# Patient Record
Sex: Male | Born: 1955 | Race: White | Hispanic: No | Marital: Single | Smoking: Current every day smoker
Health system: Southern US, Community
[De-identification: ages and names within clinical notes are randomized; demographics above are authoritative.]

## PROBLEM LIST (undated history)

## (undated) DIAGNOSIS — E118 Type 2 diabetes mellitus with unspecified complications: Secondary | ICD-10-CM

## (undated) DIAGNOSIS — I480 Paroxysmal atrial fibrillation: Secondary | ICD-10-CM

## (undated) HISTORY — PX: TONSILLECTOMY: SUR1361

## (undated) HISTORY — DX: Paroxysmal atrial fibrillation: I48.0

## (undated) HISTORY — DX: Type 2 diabetes mellitus with unspecified complications: E11.8

---

## 2008-06-02 ENCOUNTER — Ambulatory Visit: Payer: Self-pay | Admitting: Urology

## 2008-06-03 ENCOUNTER — Ambulatory Visit: Payer: Self-pay | Admitting: Urology

## 2008-06-07 ENCOUNTER — Ambulatory Visit: Payer: Self-pay | Admitting: Urology

## 2013-01-14 ENCOUNTER — Ambulatory Visit: Payer: Self-pay | Admitting: Cardiology

## 2014-10-26 ENCOUNTER — Ambulatory Visit
Admission: EM | Admit: 2014-10-26 | Discharge: 2014-10-26 | Disposition: A | Discharge status: Home / Self Care | Payer: BLUE CROSS/BLUE SHIELD | Attending: Physician Assistant | Admitting: Physician Assistant

## 2014-10-26 ENCOUNTER — Ambulatory Visit: Payer: BLUE CROSS/BLUE SHIELD

## 2014-10-26 DIAGNOSIS — J0100 Acute maxillary sinusitis, unspecified: Secondary | ICD-10-CM | POA: Diagnosis not present

## 2014-10-26 DIAGNOSIS — R51 Headache: Secondary | ICD-10-CM | POA: Diagnosis present

## 2014-10-26 DIAGNOSIS — J40 Bronchitis, not specified as acute or chronic: Secondary | ICD-10-CM | POA: Insufficient documentation

## 2014-10-26 MED ORDER — HYDROCOD POLST-CPM POLST ER 10-8 MG/5ML PO SUER: 5.0000 mL | Freq: Every evening | ORAL | Status: DC | PRN

## 2014-10-26 MED ORDER — FLUTICASONE PROPIONATE 50 MCG/ACT NA SUSP: 2.0000 | Freq: Every day | NASAL | Status: DC

## 2014-10-26 MED ORDER — DOXYCYCLINE HYCLATE 100 MG PO CAPS: 100.0000 mg | ORAL_CAPSULE | Freq: Two times a day (BID) | ORAL | Status: DC

## 2014-10-26 MED ORDER — KETOROLAC TROMETHAMINE 60 MG/2ML IM SOLN
60.0000 mg | Freq: Once | INTRAMUSCULAR | Status: AC
  Administered 2014-10-26: 60 mg via INTRAMUSCULAR

## 2014-10-26 MED ORDER — BENZONATATE 100 MG PO CAPS: 100.0000 mg | ORAL_CAPSULE | Freq: Three times a day (TID) | ORAL | Status: DC

## 2014-10-26 MED ORDER — IPRATROPIUM-ALBUTEROL 0.5-2.5 (3) MG/3ML IN SOLN
3.0000 mL | Freq: Once | RESPIRATORY_TRACT | Status: AC
  Administered 2014-10-26: 3 mL via RESPIRATORY_TRACT

## 2014-10-26 NOTE — Discharge Instructions (Signed)

## 2014-10-26 NOTE — ED Provider Notes (Signed)
CSN: [id number]     Arrival date & time 10/26/14  1216 History   None    Chief Complaint  Patient presents with  . Facial Pain   (Consider location/radiation/quality/duration/timing/severity/associated sxs/prior Treatment) HPI  59 yo M with 3 weeks hx of increasing nasal congestion, cough and sneezing. Exposed to high pollen in workshop near a field and feels he is having a reaction to same. Hx of sinus issues in the past. Present today because of persistant cough, particularly at night; and the recent onset of facial and tooth pain. DM2- Hx AFib/Plavix    Everyday smoker-cigars -greater than 25 years  Past Medical History  Diagnosis Date  . Diabetes mellitus without complication   . Atrial fibrillation    Past Surgical History  Procedure Laterality Date  . Tonsillectomy     Family History  Problem Relation Age of Onset  . Cancer Mother    History  Substance Use Topics  . Smoking status: Current Every Day Smoker    Types: Cigars  . Smokeless tobacco: Not on file  . Alcohol Use: No    Review of Systems  HENT: Positive for congestion, postnasal drip, rhinorrhea, sinus pressure and sneezing.   Eyes: Negative.   Respiratory: Positive for cough and wheezing.   Cardiovascular: Negative.   Gastrointestinal: Negative.   Endocrine: Negative.   Genitourinary: Negative.   Musculoskeletal: Negative.   Skin: Negative.   Neurological: Positive for headaches. Negative for light-headedness and numbness.  Hematological: Negative.   Psychiatric/Behavioral: Negative.     Allergies  Penicillins  Home Medications   Prior to Admission medications   Medication Sig Start Date End Date Taking? Authorizing Provider  clopidogrel (PLAVIX) 75 MG tablet Take 75 mg by mouth daily.   Yes Historical Provider, MD  metFORMIN (GLUCOPHAGE) 500 MG tablet Take by mouth 2 (two) times daily with a meal.   Yes Historical Provider, MD  benzonatate (TESSALON) 100 MG capsule Take 1 capsule (100 mg total)  by mouth every 8 (eight) hours. 10/26/14   Rae HalstedLaurie W Alison Breeding, PA-C  chlorpheniramine-HYDROcodone [facility](TUSSIONEX PENNKINETIC ER) 10-8 MG/5ML SUER Take 5 mLs by mouth at bedtime as needed for cough. 10/26/14   Rae HalstedLaurie W Roniyah Llorens, PA-C  doxycycline (VIBRAMYCIN) 100 MG capsule Take 1 capsule (100 mg total) by mouth 2 (two) times daily. 10/26/14   Rae HalstedLaurie W Shirlie Enck, PA-C  fluticasone [facility](FLONASE) 50 MCG/ACT nasal spray Place 2 sprays into both nostrils daily. 10/26/14   Rae HalstedLaurie W Tytionna Cloyd, PA-C   BP 117/68 mmHg  Pulse 60  Temp(Src) 97.3 F (36.3 C) (Tympanic)  Resp 18  Ht 5\' 11"  (1.803 m)  Wt 220 lb (99.791 kg)  BMI 30.70 kg/m2  SpO2 98% Physical Exam  Constitutional: He appears well-developed and well-nourished.  HENT:  Head: Atraumatic.  Right Ear: Tympanic membrane, external ear and ear canal normal.  Left Ear: Tympanic membrane, external ear and ear canal normal.  Eyes: EOM are normal.  Neck: Normal range of motion. Neck supple.  Cardiovascular: An irregularly irregular rhythm present.  Known hx of atrial fib - on Pradaxa and Plavix  but denies any other intervention - he denies symptoms  Pulmonary/Chest: Effort normal. He has wheezes.  Abdominal: Soft. There is no tenderness. There is no rebound.  Musculoskeletal: Normal range of motion.  Lymphadenopathy:    He has no cervical adenopathy.  Skin: Skin is warm and dry.  Pressure/headache complaint frontal and upper teeth/cheekbones pressure discomfort. Nasal discharge yellow. Cough during day- exacerbates at night  Post nasal drip, ED Course  Procedures (including critical care time) Labs Review Labs Reviewed - No data to display  DuoNeb was effective in easing chest congestion- patient reported feeling more comfortable post therapy. Toradol 60 mg IM effectively relieved him of his headache  Imaging Review Dg Chest 2 View  10/26/2014   CLINICAL DATA:  Wheezing, cough, smoking history  EXAM: CHEST  2 VIEW  COMPARISON:  None.  FINDINGS: No active infiltrate  or effusion is seen. There is peribronchial thickening which suggest bronchitis. Mediastinal and hilar contours are unremarkable. The heart is borderline enlarged. No bony abnormality is seen.  IMPRESSION: Peribronchial thickening may indicate bronchitis. No pneumonia or effusion is seen.   Electronically Signed   By: Dwyane DeePaul  Barry M.D.   On: 10/26/2014 14:20     MDM   1. Acute maxillary sinusitis, recurrence not specified   2. Bronchitis     Will Rx to cover sinuses and chest.. Have discussed seasonal allergies and patient will initiate Claritin and Flonase from now until first hard freeze. Rx for day and night cough discussed. Questions invited and answered. recommendations and expectations reviewed. RTC [facility] with questions, concerns, exacerbation. Report to PCP for re-evalaution/update  Rae HalstedLaurie W Christabelle Hanzlik, PA-C 10/26/14 2152

## 2014-10-26 NOTE — ED Notes (Signed)
States started 2 weeks ago with runny nose. Now worsening face pain/behind eyes.

## 2015-01-04 ENCOUNTER — Telehealth: Payer: Self-pay

## 2015-01-04 MED ORDER — CANAGLIFLOZIN 100 MG PO TABS: 100.0000 mg | ORAL_TABLET | Freq: Every day | ORAL | Status: DC

## 2015-01-04 NOTE — Telephone Encounter (Signed)
We'll switch from [place] to [place] I talked with patient Pick up Rx and savings card

## 2015-01-04 NOTE — Telephone Encounter (Signed)
Dr. Sherie Don, You saw this patient in May, it is documented that the Jardiance was too expensive and you would consider writing an Rx for something less.  Would you please review the note in PP and consider what another medication could be ?  Thank you- [facility] is pharmacy of choice

## 2015-01-04 NOTE — Telephone Encounter (Signed)
Pt called stated insurance will no longer pay for Jardance. Pt wants to know if something else can be sent to the pharmacy. Pharm is [facility]. Please call pt with any issues. Thanks.

## 2015-01-28 ENCOUNTER — Telehealth: Payer: Self-pay | Admitting: Unknown Physician Specialty

## 2015-01-28 MED ORDER — CANAGLIFLOZIN 300 MG PO TABS: 300.0000 mg | ORAL_TABLET | Freq: Every day | ORAL | Status: DC

## 2015-01-28 MED ORDER — METFORMIN HCL 1000 MG PO TABS: 1000.0000 mg | ORAL_TABLET | Freq: Two times a day (BID) | ORAL | Status: DC

## 2015-01-28 NOTE — Telephone Encounter (Signed)
I did the research; a PA saw him in May for an infection and she entered the metformin differently (two of the 500 mg BID instead of one of the 1000 mg BID) I clarified with patient and corrected the med list; he did not need refill; he asked if he could double his metformin for a while since his sugars are high --> NO I will increase the Invokana from 100 mg to 300 mg, new Rx to pharmacy

## 2015-01-28 NOTE — Telephone Encounter (Signed)
Pt has questions about his metformin rx. It says take twice daily but he has always taken one twice a day and he would like clarification.

## 2015-01-28 NOTE — Telephone Encounter (Signed)
Looking at [facility].  He is Dr. Marlise Eves pt

## 2015-01-28 NOTE — Telephone Encounter (Signed)
Routing to provider. Practice partner number is [phone]. Practice partner says the is a [facility] patient, but Elnita Maxwell prescribed this medication.

## 2015-01-28 NOTE — Telephone Encounter (Signed)
Routing to Dr. Lada 

## 2015-01-31 ENCOUNTER — Telehealth: Payer: Self-pay

## 2015-01-31 NOTE — Telephone Encounter (Signed)
He should be taking 1000 mg metformin BID period There was a mix-up months ago and apparently a PA at urgent care entered the dose as 500 mg I just need him to take 1000 mg BID Please work this out with patient and pharmacy I've already talked to the patient once before

## 2015-01-31 NOTE — Telephone Encounter (Signed)
In regards to his Metformin, pharmacy states that back in April and all the time before he was taking 1 tablet twice a day. He thinks he is still supposed to take only 1 tablet twice a day and that's how he has been taking it. Please advise on correct directions.

## 2015-02-01 NOTE — Telephone Encounter (Signed)
No answer, unable to leave a message. °

## 2015-02-03 NOTE — Telephone Encounter (Signed)
I spoke with patient, he states that he is taking the 1,000mg  BID. He said some of his bottles have the wrong directions but he has been taking  BID.

## 2015-03-04 ENCOUNTER — Telehealth: Payer: Self-pay

## 2015-03-04 NOTE — Telephone Encounter (Signed)
Your patient.  Thanks 

## 2015-03-04 NOTE — Telephone Encounter (Signed)
Patient was last seen 10/22/14, practice partner number is [phone], and pharmacy is [place].

## 2015-03-05 NOTE — Telephone Encounter (Signed)
I don't have any record of [place] ever prescribing Plavix for him, either in this chart or the Practice Partner chart I'm going to deny this Instruct patient to call the prescriber who gave him this to see if he's really supposed to be taking it with Pradaxa; that could increase risk of serious bleeding Patient is overdue for visit anyway (was due to see me in mid-August) Please schedule him for visit soon

## 2015-03-07 NOTE — Telephone Encounter (Signed)
Spoke with patient and stated that he is overdue for a f/u appointment. He said he would look at his schedule and call us back make it.

## 2015-04-07 ENCOUNTER — Other Ambulatory Visit: Payer: Self-pay | Admitting: Unknown Physician Specialty

## 2015-04-07 NOTE — Telephone Encounter (Signed)
Your patient.  Thanks 

## 2015-04-07 NOTE — Telephone Encounter (Signed)
Please ask patient to get this from his cardiologist He does not appear to be taking it correctly; last filled by Gabriel Cirriheryl Wicker for a 2 month supply back in May; have him call cardiologist to discuss this medicine and get refills

## 2015-04-08 NOTE — Telephone Encounter (Signed)
Tried to call patient but there was no answer and no voicemail set up. Will try again later. 

## 2015-04-11 NOTE — Telephone Encounter (Signed)
Called patient and told him him what Dr. Sherie DonLada said. Patient stated that he has not seen the cardiologist in about 2 years. States he was supposed to follow-up with them 1 year later but he never went. Patient [place] and is going to make an appointment with us within the month because his medication will run out in a month. I told the patient that I would let Dr. Sherie DonLada know this information.

## 2015-04-11 NOTE — Telephone Encounter (Signed)
I read Dr. Darrold JunkerParaschos' last note 2014; I don't see any record of Plavix in our system or his system Rx sent, but we'll want to see him soon

## 2015-06-01 ENCOUNTER — Encounter: Payer: Self-pay | Admitting: *Deleted

## 2015-06-01 ENCOUNTER — Encounter: Payer: Self-pay | Admitting: Family Medicine

## 2015-06-01 ENCOUNTER — Ambulatory Visit (INDEPENDENT_AMBULATORY_CARE_PROVIDER_SITE_OTHER): Payer: BLUE CROSS/BLUE SHIELD | Admitting: Family Medicine

## 2015-06-01 VITALS — BP 127/76 | HR 59 | Resp 16 | Ht 68.0 in | Wt 222.0 lb

## 2015-06-01 DIAGNOSIS — Z7189 Other specified counseling: Secondary | ICD-10-CM

## 2015-06-01 DIAGNOSIS — F17200 Nicotine dependence, unspecified, uncomplicated: Secondary | ICD-10-CM

## 2015-06-01 DIAGNOSIS — I499 Cardiac arrhythmia, unspecified: Secondary | ICD-10-CM

## 2015-06-01 DIAGNOSIS — Z7689 Persons encountering health services in other specified circumstances: Secondary | ICD-10-CM | POA: Insufficient documentation

## 2015-06-01 DIAGNOSIS — I480 Paroxysmal atrial fibrillation: Secondary | ICD-10-CM | POA: Insufficient documentation

## 2015-06-01 DIAGNOSIS — E1165 Type 2 diabetes mellitus with hyperglycemia: Secondary | ICD-10-CM | POA: Insufficient documentation

## 2015-06-01 DIAGNOSIS — Z720 Tobacco use: Secondary | ICD-10-CM

## 2015-06-01 DIAGNOSIS — E119 Type 2 diabetes mellitus without complications: Secondary | ICD-10-CM | POA: Diagnosis not present

## 2015-06-01 DIAGNOSIS — I482 Chronic atrial fibrillation, unspecified: Secondary | ICD-10-CM

## 2015-06-01 LAB — POCT GLYCOSYLATED HEMOGLOBIN (HGB A1C): Hemoglobin A1C: 8

## 2015-06-01 MED ORDER — METFORMIN HCL 1000 MG PO TABS: 1000.0000 mg | ORAL_TABLET | Freq: Two times a day (BID) | ORAL | Status: DC

## 2015-06-01 MED ORDER — DABIGATRAN ETEXILATE MESYLATE 150 MG PO CAPS: 150.0000 mg | ORAL_CAPSULE | Freq: Two times a day (BID) | ORAL | Status: DC

## 2015-06-01 MED ORDER — CANAGLIFLOZIN 300 MG PO TABS: 300.0000 mg | ORAL_TABLET | Freq: Every day | ORAL | Status: DC

## 2015-06-01 NOTE — Progress Notes (Signed)
Name: Jeremy KochBobby Darrell Claytor Jr.   MRN: [id number]    DOB: August 18, 1955   Date:06/01/2015       Progress Note  Subjective  Chief Complaint  Chief Complaint  Patient presents with  . Establish Care  . Diabetes    120-+220 range A1C 8.0 X 6 MO    HPI Here to establish care.  Former pt. Of Dr. Dossie Arbourrissman.  Has DM.  He has been our of Invokana x 3-4 weeks.  His BSs run from 150-220 even on the Invokana.    He has hx of A. Fib.  Has been on Pradaxa for several years.  Has not seen Card (Dr. Gwen PoundsKowalski) in 2 years.  He cannot tell when he is in a. Fib. No problem-specific assessment & plan notes found for this encounter.   Past Medical History  Diagnosis Date  . Diabetes mellitus without complication (HCC)   . Atrial fibrillation (HCC)   . Atrial fibrillation [facility](HCC)     Past Surgical History  Procedure Laterality Date  . Tonsillectomy      Family History  Problem Relation Age of Onset  . Cancer Mother     Social History   Social History  . Marital Status: Single    Spouse Name: N/A  . Number of Children: N/A  . Years of Education: N/A   Occupational History  . Not on file.   Social History Main Topics  . Smoking status: Current Every Day Smoker    Types: Cigars  . Smokeless tobacco: Never Used  . Alcohol Use: No  . Drug Use: No  . Sexual Activity: Not on file   Other Topics Concern  . Not on file   Social History Narrative     Current outpatient prescriptions:  .  dabigatran (PRADAXA) 150 MG CAPS capsule, Take 1 capsule (150 mg total) by mouth 2 (two) times daily., Disp: 180 capsule, Rfl: 3 .  metFORMIN (GLUCOPHAGE) 1000 MG tablet, Take 1 tablet (1,000 mg total) by mouth 2 (two) times daily with a meal., Disp: 180 tablet, Rfl: 3 .  canagliflozin (INVOKANA) 300 MG TABS tablet, Take 300 mg by mouth daily before breakfast., Disp: 90 tablet, Rfl: 3  Allergies  Allergen Reactions  . Penicillins      Review of Systems  Constitutional: Negative for fever, chills,  weight loss and malaise/fatigue.  HENT: Negative for hearing loss.   Eyes: Negative for blurred vision and double vision.  Respiratory: Negative for cough, shortness of breath and wheezing.   Cardiovascular: Negative for chest pain, palpitations and leg swelling.  Gastrointestinal: Negative for heartburn and nausea.  Genitourinary: Negative for dysuria, urgency and frequency.  Musculoskeletal: Negative for myalgias and joint pain.  Skin: Negative for rash.  Neurological: Negative for dizziness, tremors, weakness and headaches.      Objective  Filed Vitals:   06/01/15 1424  BP: 127/76  Pulse: 59  Resp: 16  Height: 5\' 8"  (1.727 m)  Weight: 222 lb (100.699 kg)    Physical Exam  Constitutional: He is oriented to person, place, and time and well-developed, well-nourished, and in no distress. No distress.  Eyes: Conjunctivae are normal. Pupils are equal, round, and reactive to light. No scleral icterus.  Neck: Normal range of motion. Neck supple. Carotid bruit is not present. No thyromegaly present.  Cardiovascular: Normal rate and normal heart sounds.  An irregular rhythm present. Frequent extrasystoles are present. Exam reveals no gallop and no friction rub.   No murmur heard. EKG-Left axis.  PACs.  Pulmonary/Chest: Effort normal and breath sounds normal. No respiratory distress. He has no wheezes. He has no rales.  Abdominal: Soft. Bowel sounds are normal. He exhibits no distension, no abdominal bruit and no mass. There is no tenderness.  Musculoskeletal: He exhibits no edema.  Lymphadenopathy:    He has no cervical adenopathy.  Neurological: He is alert and oriented to person, place, and time.  Vitals reviewed.      Recent Results (from the past 2160 hour(s))  POCT HgB A1C     Status: Abnormal   Collection Time: 06/01/15  2:31 PM  Result Value Ref Range   Hemoglobin A1C 8.0      Assessment & Plan  Problem List Items Addressed This Visit      Cardiovascular and  Mediastinum   A-fib (HCC)   Relevant Medications   dabigatran (PRADAXA) 150 MG CAPS capsule   Other Relevant Orders   Ambulatory referral to Cardiology     Endocrine   Diabetes (HCC)   Relevant Medications   canagliflozin (INVOKANA) 300 MG TABS tablet   metFORMIN (GLUCOPHAGE) 1000 MG tablet   Other Relevant Orders   POCT HgB A1C (Completed)     Other   Encounter to establish care - Primary    Other Visit Diagnoses    Smoker        Relevant Orders    Provide Smoking / Tobacco cessation education.  Provide education material and information on community counseling.    Irregular heart beat        Relevant Orders    EKG 12-Lead       Meds ordered this encounter  Medications  . canagliflozin (INVOKANA) 300 MG TABS tablet    Sig: Take 300 mg by mouth daily before breakfast.    Dispense:  90 tablet    Refill:  3    Increasing dose to 300 mg daily; cancel the 100 mg pills  . metFORMIN (GLUCOPHAGE) 1000 MG tablet    Sig: Take 1 tablet (1,000 mg total) by mouth 2 (two) times daily with a meal.    Dispense:  180 tablet    Refill:  3  . dabigatran (PRADAXA) 150 MG CAPS capsule    Sig: Take 1 capsule (150 mg total) by mouth 2 (two) times daily.    Dispense:  180 capsule    Refill:  3   1. Encounter to establish care   2. Type 2 diabetes mellitus without complication, unspecified long term insulin use status (HCC)  - POCT HgB A1C-8.o - canagliflozin (INVOKANA) 300 MG TABS tablet; Take 300 mg by mouth daily before breakfast.  Dispense: 90 tablet; Refill: 3 - metFORMIN (GLUCOPHAGE) 1000 MG tablet; Take 1 tablet (1,000 mg total) by mouth 2 (two) times daily with a meal.  Dispense: 180 tablet; Refill: 3  3. Chronic atrial fibrillation (HCC)  - dabigatran (PRADAXA) 150 MG CAPS capsule; Take 1 capsule (150 mg total) by mouth 2 (two) times daily.  Dispense: 180 capsule; Refill: 3 - Ambulatory referral to Cardiology  4. Smoker  - Provide Smoking / Tobacco cessation education.   Provide education material and information on community counseling.  5. Irregular heart beat  - EKG 12-Lead-PACs.  Left axis

## 2015-06-01 NOTE — Patient Instructions (Signed)
Discussed diet and weight loss and stopping smoking cigars.

## 2015-06-07 ENCOUNTER — Other Ambulatory Visit: Payer: Self-pay | Admitting: Family Medicine

## 2015-06-07 DIAGNOSIS — E119 Type 2 diabetes mellitus without complications: Secondary | ICD-10-CM

## 2015-06-07 MED ORDER — METFORMIN HCL 1000 MG PO TABS: 1000.0000 mg | ORAL_TABLET | Freq: Two times a day (BID) | ORAL | Status: DC

## 2015-06-22 ENCOUNTER — Other Ambulatory Visit: Payer: Self-pay | Admitting: *Deleted

## 2015-06-22 ENCOUNTER — Other Ambulatory Visit: Payer: Self-pay | Admitting: Unknown Physician Specialty

## 2015-06-22 DIAGNOSIS — E119 Type 2 diabetes mellitus without complications: Secondary | ICD-10-CM

## 2015-06-22 MED ORDER — METFORMIN HCL 1000 MG PO TABS: 1000.0000 mg | ORAL_TABLET | Freq: Two times a day (BID) | ORAL | Status: DC

## 2015-07-25 ENCOUNTER — Ambulatory Visit: Payer: BLUE CROSS/BLUE SHIELD | Admitting: Cardiovascular Disease

## 2015-07-26 ENCOUNTER — Ambulatory Visit: Payer: BLUE CROSS/BLUE SHIELD | Admitting: Cardiovascular Disease

## 2015-08-01 ENCOUNTER — Ambulatory Visit (INDEPENDENT_AMBULATORY_CARE_PROVIDER_SITE_OTHER): Payer: BLUE CROSS/BLUE SHIELD | Admitting: Family Medicine

## 2015-08-01 ENCOUNTER — Encounter: Payer: Self-pay | Admitting: Family Medicine

## 2015-08-01 VITALS — BP 125/60 | HR 60 | Resp 16 | Ht 68.0 in | Wt 220.3 lb

## 2015-08-01 DIAGNOSIS — I482 Chronic atrial fibrillation, unspecified: Secondary | ICD-10-CM

## 2015-08-01 DIAGNOSIS — N200 Calculus of kidney: Secondary | ICD-10-CM | POA: Diagnosis not present

## 2015-08-01 DIAGNOSIS — E119 Type 2 diabetes mellitus without complications: Secondary | ICD-10-CM | POA: Diagnosis not present

## 2015-08-01 DIAGNOSIS — Z23 Encounter for immunization: Secondary | ICD-10-CM

## 2015-08-01 MED ORDER — TRAMADOL HCL 50 MG PO TABS: 50.0000 mg | ORAL_TABLET | Freq: Three times a day (TID) | ORAL | Status: DC | PRN

## 2015-08-01 NOTE — Progress Notes (Signed)
Name: Jeremy Kelly.   MRN: [id number]    DOB: 03-25-1956   Date:08/01/2015       Progress Note  Subjective  Chief Complaint  Chief Complaint  Patient presents with  . Diabetes    Left ear popping    HPI Here for f/u of DM.  Also with a.fib.  Had seen Card at [facility] in past.  Is on Pradaxa for past about 2 years.    BSs 120s to 160s in AM.  2-3 hrs post prandial, BSs up to 180-210.    No problem-specific assessment & plan notes found for this encounter.   Past Medical History  Diagnosis Date  . Diabetes mellitus without complication (HCC)   . Atrial fibrillation (HCC)   . Atrial fibrillation [facility])     Past Surgical History  Procedure Laterality Date  . Tonsillectomy      Family History  Problem Relation Age of Onset  . Cancer Mother     Social History   Social History  . Marital Status: Single    Spouse Name: N/A  . Number of Children: N/A  . Years of Education: N/A   Occupational History  . Not on file.   Social History Main Topics  . Smoking status: Current Every Day Smoker    Types: Cigars  . Smokeless tobacco: Never Used  . Alcohol Use: No  . Drug Use: No  . Sexual Activity: Not on file   Other Topics Concern  . Not on file   Social History Narrative     Current outpatient prescriptions:  .  canagliflozin (INVOKANA) 300 MG TABS tablet, Take 300 mg by mouth daily before breakfast., Disp: 90 tablet, Rfl: 3 .  dabigatran (PRADAXA) 150 MG CAPS capsule, Take 1 capsule (150 mg total) by mouth 2 (two) times daily., Disp: 180 capsule, Rfl: 3 .  metFORMIN (GLUCOPHAGE) 1000 MG tablet, Take 1 tablet (1,000 mg total) by mouth 2 (two) times daily with a meal., Disp: 180 tablet, Rfl: 3 .  traMADol (ULTRAM) 50 MG tablet, Take 1 tablet (50 mg total) by mouth every 8 (eight) hours as needed., Disp: 30 tablet, Rfl: 3  Allergies  Allergen Reactions  . Penicillins      Review of Systems  Constitutional: Negative for fever, chills, weight loss and  malaise/fatigue.  HENT: Negative for hearing loss.   Eyes: Negative for blurred vision and double vision.  Respiratory: Negative for cough, shortness of breath and wheezing.   Cardiovascular: Negative for chest pain, palpitations and leg swelling.  Gastrointestinal: Negative for heartburn, abdominal pain and blood in stool.  Genitourinary: Negative for dysuria, urgency and frequency.       Having kidney stone pain recently.  Musculoskeletal: Negative for myalgias and joint pain.  Skin: Positive for rash.  Neurological: Negative for tremors, weakness and headaches.      Objective  Filed Vitals:   08/01/15 0954 08/01/15 1023  BP: 125/60   Pulse: 49 60  Resp: 16   Height:  (1.727 m)   Weight: 220 lb 4.8 oz (99.927 kg)     Physical Exam  Constitutional: He is oriented to person, place, and time and well-developed, well-nourished, and in no distress. No distress.  HENT:  Head: Normocephalic and atraumatic.  Right Ear: External ear normal.  Left Ear: External ear normal.  Eyes: Conjunctivae and EOM are normal. Pupils are equal, round, and reactive to light. No scleral icterus.  Neck: Normal range of motion. Neck supple. No thyromegaly  present.  Cardiovascular: Normal rate and normal heart sounds.  An irregularly irregular rhythm present.  Pulmonary/Chest: Effort normal and breath sounds normal. No respiratory distress. He has no wheezes. He exhibits no tenderness.  Musculoskeletal: He exhibits no edema.  Lymphadenopathy:    He has no cervical adenopathy.  Neurological: He is alert and oriented to person, place, and time.  Vitals reviewed.      Recent Results (from the past 2160 hour(s))  POCT HgB A1C     Status: Abnormal   Collection Time: 06/01/15  2:31 PM  Result Value Ref Range   Hemoglobin A1C 8.0      Assessment & Plan  Problem List Items Addressed This Visit      Cardiovascular and Mediastinum   A-fib (HCC)   Relevant Orders   CBC with Differential      Endocrine   Diabetes (HCC) - Primary   Relevant Orders   Comprehensive Metabolic Panel (CMET)   Lipid Profile     Genitourinary   Renal stones   Relevant Medications   traMADol (ULTRAM) 50 MG tablet     Other   Need for influenza vaccination   Relevant Orders   Flu Vaccine QUAD 36+ mos PF IM (Fluarix & Fluzone Quad PF) (Completed)      Meds ordered this encounter  Medications  . DISCONTD: INVOKANA 100 MG TABS tablet    Sig:     Refill:  1  . traMADol (ULTRAM) 50 MG tablet    Sig: Take 1 tablet (50 mg total) by mouth every 8 (eight) hours as needed.    Dispense:  30 tablet    Refill:  3   1. Type 2 diabetes mellitus without complication, without long-term current use of insulin (HCC)  - Comprehensive Metabolic Panel (CMET) - Lipid Profile  2. Need for influenza vaccination  - Flu Vaccine QUAD 36+ mos PF IM (Fluarix & Fluzone Quad PF)  3. Renal stones  - traMADol (ULTRAM) 50 MG tablet; Take 1 tablet (50 mg total) by mouth every 8 (eight) hours as needed.  Dispense: 30 tablet; Refill: 3  4. Chronic atrial fibrillation (HCC)  - CBC with Differential

## 2015-08-06 LAB — COMPREHENSIVE METABOLIC PANEL
ALK PHOS: 63 IU/L (ref 39–117)
ALT: 27 IU/L (ref 0–44)
AST: 19 IU/L (ref 0–40)
Albumin/Globulin Ratio: 1.6 (ref 1.1–2.5)
Albumin: 4.4 g/dL (ref 3.5–5.5)
BUN/Creatinine Ratio: 22 — ABNORMAL HIGH (ref 9–20)
BUN: 26 mg/dL — ABNORMAL HIGH (ref 6–24)
Bilirubin Total: 0.5 mg/dL (ref 0.0–1.2)
CHLORIDE: 101 mmol/L (ref 96–106)
CO2: 21 mmol/L (ref 18–29)
CREATININE: 1.19 mg/dL (ref 0.76–1.27)
Calcium: 9.5 mg/dL (ref 8.7–10.2)
GFR calc Af Amer: 77 mL/min/{1.73_m2} (ref >59)
GFR calc non Af Amer: 67 mL/min/{1.73_m2} (ref >59)
GLUCOSE: 284 mg/dL — AB (ref 65–99)
Globulin, Total: 2.7 g/dL (ref 1.5–4.5)
Potassium: 4.8 mmol/L (ref 3.5–5.2)
SODIUM: 139 mmol/L (ref 134–144)
Total Protein: 7.1 g/dL (ref 6.0–8.5)

## 2015-08-06 LAB — LIPID PANEL
CHOLESTEROL TOTAL: 119 mg/dL (ref 100–199)
Chol/HDL Ratio: 2.9 ratio units (ref 0.0–5.0)
HDL: 41 mg/dL (ref >39)
LDL Calculated: 46 mg/dL (ref 0–99)
TRIGLYCERIDES: 160 mg/dL — AB (ref 0–149)
VLDL Cholesterol Cal: 32 mg/dL (ref 5–40)

## 2015-08-06 LAB — CBC WITH DIFFERENTIAL/PLATELET
BASOS ABS: 0 10*3/uL (ref 0.0–0.2)
Basos: 1 %
EOS (ABSOLUTE): 0.2 10*3/uL (ref 0.0–0.4)
EOS: 4 %
Hematocrit: 46.5 % (ref 37.5–51.0)
Hemoglobin: 16 g/dL (ref 12.6–17.7)
IMMATURE GRANULOCYTES: 0 %
Immature Grans (Abs): 0 10*3/uL (ref 0.0–0.1)
Lymphocytes Absolute: 1.1 10*3/uL (ref 0.7–3.1)
Lymphs: 20 %
MCH: 30.6 pg (ref 26.6–33.0)
MCHC: 34.4 g/dL (ref 31.5–35.7)
MCV: 89 fL (ref 79–97)
MONOS ABS: 0.5 10*3/uL (ref 0.1–0.9)
Monocytes: 9 %
NEUTROS PCT: 66 %
Neutrophils Absolute: 3.8 10*3/uL (ref 1.4–7.0)
Platelets: 205 10*3/uL (ref 150–379)
RBC: 5.23 x10E6/uL (ref 4.14–5.80)
RDW: 13.6 % (ref 12.3–15.4)
WBC: 5.7 10*3/uL (ref 3.4–10.8)

## 2015-09-02 ENCOUNTER — Encounter (INDEPENDENT_AMBULATORY_CARE_PROVIDER_SITE_OTHER): Payer: Self-pay

## 2015-09-02 ENCOUNTER — Encounter: Payer: Self-pay | Admitting: Cardiovascular Disease

## 2015-09-02 ENCOUNTER — Ambulatory Visit (INDEPENDENT_AMBULATORY_CARE_PROVIDER_SITE_OTHER): Payer: BLUE CROSS/BLUE SHIELD | Admitting: Cardiovascular Disease

## 2015-09-02 VITALS — BP 134/72 | HR 54 | Ht 71.0 in | Wt 220.5 lb

## 2015-09-02 DIAGNOSIS — I491 Atrial premature depolarization: Secondary | ICD-10-CM | POA: Insufficient documentation

## 2015-09-02 DIAGNOSIS — E119 Type 2 diabetes mellitus without complications: Secondary | ICD-10-CM | POA: Diagnosis not present

## 2015-09-02 DIAGNOSIS — I482 Chronic atrial fibrillation, unspecified: Secondary | ICD-10-CM

## 2015-09-02 NOTE — Assessment & Plan Note (Signed)
Previous diagnosis of atrial fibrillation  We will try to obtain prior EKGs and documentation of [facility].  Records have been requested  Did  suggest he research the price of other anticoagulation medications,  Such as xarelto and eliquis

## 2015-09-02 NOTE — Patient Instructions (Signed)
You are doing well. No medication changes were made.  Check the price if xarelto (one a day), eliquis (twice a day)  Please call us if you have new issues that need to be addressed before your next appt.  Your physician wants you to follow-up in: 12 months.  You will receive a reminder letter in the mail two months in advance. If you don't receive a letter, please call our office to schedule the follow-up appointment.

## 2015-09-02 NOTE — Progress Notes (Signed)
Patient ID: Jeremy Kelly., male    DOB: Aug 02, 1955, 60 y.o.   MRN: [id number]  HPI Comments: [name] is a pleasant 60 year old gentleman, referred for consultation by Dr. Juanetta GoslingHawkins for history of atrial fibrillation.  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 No EKG was done at the time. At the time was seen someone covering for Dr. Dossie Arbourrissman.  From there he was sent to cardiology at [facility]. Records are not available at this time but have been requested on today's visit. He was given a stress test, leading to cardiac catheterization done by Dr. Cassie FreerParachos. He was told there was no significant disease. We did obtain the cardiac catheterization report that showed normal ejection fraction, no significant disease, mild luminal irregularities of the LAD. This will be scanned into our system  He was started on pradaxa 150 mill grams twice a day at the time and has continued this medication since that time  He denies any complications from the blood thinner  He does report sugars have been running high, trying to eat better Previous hemoglobin A1c in December 2016 was 8.0 Review of lab work with him shows total cholesterol 119, LDL 46  EKG on today's visit shows normal sinus rhythm with rate 54 bpm, APCs noted    Allergies  Allergen Reactions  . Penicillins     Current Outpatient Prescriptions on File Prior to Visit  Medication Sig Dispense Refill  . canagliflozin (INVOKANA) 300 MG TABS tablet Take 300 mg by mouth daily before breakfast. 90 tablet 3  . dabigatran (PRADAXA) 150 MG CAPS capsule Take 1 capsule (150 mg total) by mouth 2 (two) times daily. 180 capsule 3  . metFORMIN (GLUCOPHAGE) 1000 MG tablet Take 1 tablet (1,000 mg total) by mouth 2 (two)  times daily with a meal. 180 tablet 3   No current facility-administered medications on file prior to visit.    Past Medical History  Diagnosis Date  . Diabetes mellitus without complication (HCC)   . Atrial fibrillation (HCC)   . Atrial fibrillation [facility](HCC)     Past Surgical History  Procedure Laterality Date  . Tonsillectomy      Social History  reports that he has been smoking Cigars.  He has never used smokeless tobacco. He reports that he does not drink alcohol or use illicit drugs.  Family History family history includes Cancer in his mother.    Review of Systems  Constitutional: Negative.   Respiratory: Negative.   Cardiovascular: Negative.   Gastrointestinal: Negative.   Musculoskeletal: Negative.   Neurological: Negative.   Hematological: Negative.   Psychiatric/Behavioral: Negative.   All other systems reviewed and are negative.   BP 134/72 mmHg  Pulse 54  Ht 5\' 11"  (1.803 m)  Wt 220 lb 8 oz (100.018 kg)  BMI 30.77 kg/m2  Physical Exam  Constitutional: He is oriented to person, place, and time. He appears well-developed and well-nourished.  HENT:  Head: Normocephalic.  Nose: Nose normal.  Mouth/Throat: Oropharynx is clear and moist.  Eyes: Conjunctivae are normal. Pupils are equal, round, and reactive to light.  Neck: Normal range of motion. Neck supple. No JVD present.  Cardiovascular: Normal rate, regular rhythm, normal heart sounds and intact distal pulses.  Frequent extrasystoles are present. Exam reveals no  gallop and no friction rub.   No murmur heard. Pulmonary/Chest: Effort normal and breath sounds normal. No respiratory distress. He has no wheezes. He has no rales. He exhibits no tenderness.  Abdominal: Soft. Bowel sounds are normal. He exhibits no distension. There is no tenderness.  Musculoskeletal: Normal range of motion. He exhibits no edema or tenderness.  Lymphadenopathy:    He has no cervical adenopathy.  Neurological: He is alert and  oriented to person, place, and time. Coordination normal.  Skin: Skin is warm and dry. No rash noted. No erythema.  Psychiatric: He has a normal mood and affect. His behavior is normal. Judgment and thought content normal.

## 2015-09-02 NOTE — Assessment & Plan Note (Signed)
We have encouraged continued exercise, careful diet management in an effort to lose weight. 

## 2015-09-02 NOTE — Assessment & Plan Note (Addendum)
Frequent ectopy noted on EKG today on on clinical exam.  We'll try to verify prior history of atrial fibrillation.   discussed findings of PACs with him, likely a benign finding. No changes to the medications needed   Total encounter time more than 40 minutes  Greater than 50% was spent in counseling and coordination of care with the patient

## 2015-09-05 ENCOUNTER — Other Ambulatory Visit: Payer: Self-pay

## 2015-09-05 MED ORDER — RIVAROXABAN 20 MG PO TABS: 20.0000 mg | ORAL_TABLET | Freq: Every day | ORAL | Status: DC

## 2015-09-12 ENCOUNTER — Ambulatory Visit: Payer: BLUE CROSS/BLUE SHIELD | Admitting: Family Medicine

## 2015-10-04 ENCOUNTER — Other Ambulatory Visit: Payer: Self-pay | Admitting: Family Medicine

## 2016-03-17 ENCOUNTER — Ambulatory Visit
Admission: EM | Admit: 2016-03-17 | Discharge: 2016-03-17 | Disposition: A | Discharge status: Home / Self Care | Payer: BLUE CROSS/BLUE SHIELD | Attending: Family Medicine | Admitting: Family Medicine

## 2016-03-17 ENCOUNTER — Ambulatory Visit (INDEPENDENT_AMBULATORY_CARE_PROVIDER_SITE_OTHER): Payer: BLUE CROSS/BLUE SHIELD

## 2016-03-17 ENCOUNTER — Ambulatory Visit
Admission: RE | Admit: 2016-03-17 | Discharge: 2016-03-17 | Disposition: A | Discharge status: Home / Self Care | Payer: BLUE CROSS/BLUE SHIELD | Source: Ambulatory Visit | Attending: Family Medicine | Admitting: Family Medicine

## 2016-03-17 ENCOUNTER — Telehealth: Payer: Self-pay | Admitting: Family Medicine

## 2016-03-17 ENCOUNTER — Ambulatory Visit
Admission: RE | Admit: 2016-03-17 | Discharge: 2016-03-17 | Disposition: A | Discharge status: Home / Self Care | Payer: BLUE CROSS/BLUE SHIELD | Source: Ambulatory Visit | Attending: Registered Nurse | Admitting: Registered Nurse

## 2016-03-17 DIAGNOSIS — S8011XA Contusion of right lower leg, initial encounter: Secondary | ICD-10-CM | POA: Diagnosis not present

## 2016-03-17 DIAGNOSIS — S8001XA Contusion of right knee, initial encounter: Secondary | ICD-10-CM

## 2016-03-17 DIAGNOSIS — T148XXA Other injury of unspecified body region, initial encounter: Secondary | ICD-10-CM

## 2016-03-17 DIAGNOSIS — W228XXA Striking against or struck by other objects, initial encounter: Secondary | ICD-10-CM | POA: Diagnosis not present

## 2016-03-17 DIAGNOSIS — S80811A Abrasion, right lower leg, initial encounter: Secondary | ICD-10-CM | POA: Diagnosis not present

## 2016-03-17 DIAGNOSIS — S8991XA Unspecified injury of right lower leg, initial encounter: Secondary | ICD-10-CM | POA: Diagnosis present

## 2016-03-17 DIAGNOSIS — L089 Local infection of the skin and subcutaneous tissue, unspecified: Secondary | ICD-10-CM

## 2016-03-17 MED ORDER — MUPIROCIN CALCIUM 2 % EX CREA: 1.0000 "application " | TOPICAL_CREAM | Freq: Two times a day (BID) | CUTANEOUS | 0 refills | Status: DC

## 2016-03-17 MED ORDER — ACETAMINOPHEN 500 MG PO TABS: 1000.0000 mg | ORAL_TABLET | Freq: Four times a day (QID) | ORAL | 0 refills | Status: AC | PRN

## 2016-03-17 MED ORDER — CEPHALEXIN 500 MG PO CAPS: 500.0000 mg | ORAL_CAPSULE | Freq: Four times a day (QID) | ORAL | 0 refills | Status: DC

## 2016-03-17 NOTE — ED Provider Notes (Signed)
CSN: [id number]     Arrival date & time 03/17/16  1157 History   First MD Initiated Contact with Patient 03/17/16 1214     Chief Complaint  Patient presents with  . Leg Swelling    Right Leg Knee down   (Consider location/radiation/quality/duration/timing/severity/associated sxs/prior Treatment) Married caucasian male here for evaluation swelling, bruising right lower extremity; knee red and swollen also 4 days ago plywood hit right knee and has been tender since that time along with small scratch bled a little; he woke up this am and had large swollen area on his right calf and ankle bruising.  Denied pain with ambulation/weight bearing.  Blood sugars usual 170  Denied fever/chills/purulent discharge      Past Medical History:  Diagnosis Date  . Atrial fibrillation (HCC)   . Atrial fibrillation (HCC)   . Diabetes mellitus without complication [facility](HCC)    Past Surgical History:  Procedure Laterality Date  . TONSILLECTOMY     Family History  Problem Relation Age of Onset  . Cancer Mother    Social History  Substance Use Topics  . Smoking status: Current Every Day Smoker    Types: Cigars  . Smokeless tobacco: Never Used  . Alcohol use No    Review of Systems  Constitutional: Negative for activity change, appetite change, chills, diaphoresis, fatigue and fever.  HENT: Negative for ear pain and sore throat.   Eyes: Negative for pain and visual disturbance.  Respiratory: Negative for cough and shortness of breath.   Cardiovascular: Negative for chest pain and palpitations.  Gastrointestinal: Negative for abdominal pain and vomiting.  Endocrine: Negative for cold intolerance and heat intolerance.  Genitourinary: Negative for dysuria and hematuria.  Musculoskeletal: Positive for joint swelling and myalgias. Negative for arthralgias, back pain, gait problem, neck pain and neck stiffness.  Skin: Positive for color change, rash and wound. Negative for pallor.  Allergic/Immunologic:  Negative for environmental allergies, food allergies and immunocompromised state.  Neurological: Negative for dizziness, tremors, seizures, syncope, facial asymmetry, speech difficulty, weakness, light-headedness, numbness and headaches.  Hematological: Negative for adenopathy. Bruises/bleeds easily.  Psychiatric/Behavioral: Negative for sleep disturbance.  All other systems reviewed and are negative.   Allergies  Penicillins  Home Medications   Prior to Admission medications   Medication Sig Start Date End Date Taking? Authorizing Provider  dabigatran (PRADAXA) 150 MG CAPS capsule Take 1 capsule (150 mg total) by mouth 2 (two) times daily. 06/01/15  Yes Janeann ForehandJames H Hawkins Jr., MD  INVOKANA 300 MG TABS tablet Take 300 mg by mouth daily before breakfast. 10/04/15  Yes Janeann ForehandJames H Hawkins Jr., MD  metFORMIN (GLUCOPHAGE) 1000 MG tablet Take 1 tablet (1,000 mg total) by mouth 2 (two) times daily with a meal. 06/22/15  Yes Janeann ForehandJames H Hawkins Jr., MD  rivaroxaban (XARELTO) 20 MG TABS tablet Take 1 tablet (20 mg total) by mouth daily with supper. 09/05/15  Yes Antonieta Ibaimothy J Gollan, MD  acetaminophen (TYLENOL) 500 MG tablet Take 2 tablets (1,000 mg total) by mouth every 6 (six) hours as needed for moderate pain. 03/17/16 03/24/16  Barbaraann Barthelina A Jontue Crumpacker, NP  cephALEXin (KEFLEX) 500 MG capsule Take 1 capsule (500 mg total) by mouth 4 (four) times daily. 03/17/16   Barbaraann Barthelina A Deni Berti, NP  mupirocin cream (BACTROBAN) 2 % Apply 1 application topically 2 (two) times daily. 03/17/16   Barbaraann Barthelina A Starlit Raburn, NP   Meds Ordered and Administered this Visit  Medications - No data to display  BP (!) 154/74 (BP Location: Left Arm)  Pulse 74   Temp 97.8 F (36.6 C) (Tympanic)   Resp 18   Ht 5\' 8"  (1.727 m)   Wt 215 lb (97.5 kg)   SpO2 98%   BMI 32.69 kg/m  No data found.   Physical Exam  Constitutional: He is oriented to person, place, and time. Vital signs are normal. He appears well-developed and well-nourished. He is active  and cooperative.  Non-toxic appearance. He does not have a sickly appearance. He does not appear ill. No distress.  HENT:  Head: Normocephalic and atraumatic.  Right Ear: Hearing and external ear normal.  Left Ear: Hearing and external ear normal.  Nose: Nose normal.  Mouth/Throat: Uvula is midline, oropharynx is clear and moist and mucous membranes are normal. No oropharyngeal exudate.  Eyes: Conjunctivae, EOM and lids are normal. Pupils are equal, round, and reactive to light. Right eye exhibits no discharge. Left eye exhibits no discharge. No scleral icterus.  Neck: Trachea normal, normal range of motion and phonation normal. Neck supple. No tracheal deviation present. No thyromegaly present.  Cardiovascular: Normal rate, regular rhythm, normal heart sounds and intact distal pulses.  Exam reveals no gallop and no friction rub.   No murmur heard. Pulses:      Dorsalis pedis pulses are 2+ on the right side, and 2+ on the left side.       Posterior tibial pulses are 2+ on the right side, and 2+ on the left side.  Right lower extremity ankle 2+/4 nonpitting edema; ecchymosis medial malleolus; right medial gastrocnemius 4cm firm nodule slightly TTP; negative bilateral homan's sign  Pulmonary/Chest: Effort normal and breath sounds normal. No stridor. No respiratory distress. He has no decreased breath sounds. He has no wheezes. He has no rhonchi. He has no rales. He exhibits no tenderness.  Abdominal: Soft. He exhibits no distension. There is no tenderness.  Musculoskeletal: He exhibits edema, tenderness and deformity.       Right shoulder: Normal.       Left shoulder: Normal.       Right elbow: Normal.      Left elbow: Normal.       Right hip: Normal.       Left hip: Normal.       Right knee: He exhibits erythema. He exhibits normal range of motion, no swelling, no effusion, no ecchymosis, no deformity, no laceration, normal alignment, normal patellar mobility and no bony tenderness. Tenderness  found.       Left knee: Normal.       Right ankle: He exhibits swelling and ecchymosis. He exhibits normal range of motion, no deformity, no laceration and normal pulse. Tenderness. Medial malleolus tenderness found. No lateral malleolus, no CF ligament, no posterior TFL, no head of 5th metatarsal and no proximal fibula tenderness found. Achilles tendon normal.       Left ankle: Normal.       Cervical back: Normal.       Thoracic back: Normal.       Lumbar back: Normal.       Right hand: Normal.       Left hand: Normal.       Right lower leg: He exhibits tenderness, swelling and deformity. He exhibits no bony tenderness, no edema and no laceration.       Left lower leg: Normal.       Legs:      Right foot: Normal.       Left foot: Normal.  Lymphadenopathy:  He has no cervical adenopathy.  Neurological: He is alert and oriented to person, place, and time. He has normal strength. He displays no atrophy and no tremor. No cranial nerve deficit or sensory deficit. He exhibits normal muscle tone. He displays no seizure activity. Coordination and gait normal. GCS eye subscore is 4. GCS verbal subscore is 5. GCS motor subscore is 6.  Skin: Skin is warm and dry. Capillary refill takes less than 2 seconds. Abrasion, bruising, ecchymosis and rash noted. No burn, no laceration, no lesion, no petechiae and no purpura noted. Rash is macular. Rash is not papular, not maculopapular, not nodular, not pustular, not vesicular and not urticarial. He is not diaphoretic. There is erythema. No cyanosis. No pallor. Nails show no clubbing.     Psychiatric: He has a normal mood and affect. His speech is normal and behavior is normal. Judgment and thought content normal. He is not actively hallucinating. Cognition and memory are normal. He is attentive.  Nursing note and vitals reviewed.   Urgent Care Course   Clinical Course    Procedures (including critical care time)  Labs Review Labs Reviewed - No data to  display  Imaging Review Dg Tibia/fibula Right  Result Date: 03/17/2016 CLINICAL DATA:  Right lower leg swelling and bruising with injury 4 days ago. Initial encounter. EXAM: RIGHT TIBIA AND FIBULA - 2 VIEW COMPARISON:  None. FINDINGS: There is no evidence of fracture or other focal bone lesions. Soft tissues are unremarkable. There are some vascular calcifications involving tibial arteries in a pattern consistent with underlying diabetes. IMPRESSION: No acute injury identified. Electronically Signed   By: Irish Lack M.D.   On: 03/17/2016 12:57   US Venous Img Lower Unilateral Right  Result Date: 03/17/2016 CLINICAL DATA:  60 year old male with a history of Xarelto the use and recent trauma with pain in hematoma of right lower extremity. Possible DVT. EXAM: RIGHT LOWER EXTREMITY VENOUS DOPPLER ULTRASOUND TECHNIQUE: Gray-scale sonography with graded compression, as well as color Doppler and duplex ultrasound were performed to evaluate the lower extremity deep venous systems from the level of the common femoral vein and including the common femoral, femoral, profunda femoral, popliteal and calf veins including the posterior tibial, peroneal and gastrocnemius veins when visible. The superficial great saphenous vein was also interrogated. Spectral Doppler was utilized to evaluate flow at rest and with distal augmentation maneuvers in the common femoral, femoral and popliteal veins. COMPARISON:  None. FINDINGS: Contralateral Common Femoral Vein: Respiratory phasicity is normal and symmetric with the symptomatic side. No evidence of thrombus. Normal compressibility. Common Femoral Vein: No evidence of thrombus. Normal compressibility, respiratory phasicity and response to augmentation. Saphenofemoral Junction: No evidence of thrombus. Normal compressibility and flow on color Doppler imaging. Profunda Femoral Vein: No evidence of thrombus. Normal compressibility and flow on color Doppler imaging. Femoral Vein:  No evidence of thrombus. Normal compressibility, respiratory phasicity and response to augmentation. Popliteal Vein: No evidence of thrombus. Normal compressibility, respiratory phasicity and response to augmentation. Calf Veins: No evidence of thrombus. Normal compressibility and flow on color Doppler imaging. Superficial Great Saphenous Vein: No evidence of thrombus. Normal compressibility and flow on color Doppler imaging. Other Findings: Discrete focus of heterogeneously hypoechoic soft tissue/fluid in the proximal right lower leg, medial aspect involving the calf. Dimension is 6.5 cm x 1.8 cm x 3.5 cm. IMPRESSION: Sonographic survey of the right lower extremity negative for DVT. Focus of heterogeneously hypoechoic fluid/soft tissue in the proximal right calf, region of clinical concern. Given  the history of the patient's usage of blood thinners, the leading differential diagnosis would be hematoma/blood products. Less likely infection or seroma. Signed, Yvone Neu. Loreta Ave, DO [facility] Radiology Electronically Signed   By: Gilmer Mor D.O.   On: 03/17/2016 15:49   1615 patient notified via telephone [place] negative for DVT probable hematoma.  His normal pharmacy closed at 2pm was unable to fill Rx due to ultrasound requested Rx be sent to [facility] [place], [place].  Sent electronically see tcon.  Continue plan of care as previously discussed compression hose, rest, ice, elevation and take medications as prescribed for skin infection.  Follow up re-evaluation if new or worsening symptoms e.g. Swelling legs/erythema/pain worsening over the weekend despite plan of care.  Patient verbalized understanding information/instructions, agreed with plan of care and had no further questions at this time.     MDM   1. Contusion of right knee and lower leg, initial encounter   2. Hematoma   3. Abrasion, leg w/ infection, right, initial encounter    Rx mupirocin 2% ointment BID  until healed.  Wash BID with soap and water.  Keflex 500mg  po QID x 7 days.  Held bactrim DS as interacts with xarelto and his diabetes medications.  Penicillin allergy as child rash not anaphylaxis.  Exitcare handout on skin infection given to patient.  RTC if worsening erythema, pain, purulent discharge, fever.  Wash towels, washcloths, sheets in hot water with bleach every couple of days until infection resolved.  Patient verbalized understanding, agreed with plan of care and had no further questions at this time.    Wet read leg xray negative.  Patient driving self to [facility] for [place] r/o DVT.  Will call with results once available.  Avoid kneeling on right knee until symptoms resolve.  Tylenol 1000mg  po QID prn pain. Cryotherapy 15 minutes QID.  Patient was instructed to rest, ice and elevate right leg when sitting.  Avoid prolonged sitting with leg dependent or prolonged standing.  Do not soak hand until abrasions healed avoid pool, lake, hot tub, dirty sink water.  Exitcare handout on contusion, abrasion, bursitis, hematoma, DVT given to patient.   Medications as directed.  Call or return to clinic as needed if these symptoms worsen or fail to improve as anticipated.  Patient verbalized agreement and understanding of treatment plan.  P2:  ROM, injury prevention    Barbaraann Barthel, NP 03/17/16 1628

## 2016-03-17 NOTE — ED Triage Notes (Addendum)
Patient has been putting down a floor in his house and he hit his knee and has a small scratch. His leg is now swollen and red. He denies any pain. Its on his right leg from the knee down.

## 2016-03-17 NOTE — Telephone Encounter (Signed)
Patient contacted via telephone notified US showed collection of blood/fluid.  No blood clot noted probable hematoma.  Rest, compression stocking, elevate today and tomorrow, ice TID 15 minutes  Due to Xarelto use could still be actively bleeding.  If worsening symptoms, pain, swelling follow up for re-evaluation.  [facility] for medications [facility] as patients pharmacy closed at 2pm and will not reopen until Monday per his request as I recommended he start keflex and mupirocin today and not wait until Monday 9 Oct.  Patient verbalized understanding information/instructions, agreed with plan of care and had no further questions at this time.

## 2016-03-19 ENCOUNTER — Telehealth: Payer: Self-pay

## 2016-03-19 NOTE — Telephone Encounter (Signed)
Courtesy call back completed today after patient's visit at [facility]. Patient improved and will call back with any questions or concerns.  

## 2016-04-02 ENCOUNTER — Other Ambulatory Visit: Payer: Self-pay | Admitting: Cardiovascular Disease

## 2016-04-09 ENCOUNTER — Ambulatory Visit (INDEPENDENT_AMBULATORY_CARE_PROVIDER_SITE_OTHER): Payer: BLUE CROSS/BLUE SHIELD | Admitting: Family Medicine

## 2016-04-09 ENCOUNTER — Encounter: Payer: Self-pay | Admitting: Family Medicine

## 2016-04-09 ENCOUNTER — Ambulatory Visit
Admission: RE | Admit: 2016-04-09 | Discharge: 2016-04-09 | Disposition: A | Discharge status: Home / Self Care | Payer: BLUE CROSS/BLUE SHIELD | Source: Ambulatory Visit | Attending: Family Medicine | Admitting: Family Medicine

## 2016-04-09 ENCOUNTER — Ambulatory Visit: Payer: BLUE CROSS/BLUE SHIELD | Admitting: Family Medicine

## 2016-04-09 VITALS — BP 135/69 | HR 50 | Temp 97.6°F | Resp 16 | Ht 71.0 in | Wt 215.0 lb

## 2016-04-09 DIAGNOSIS — I70201 Unspecified atherosclerosis of native arteries of extremities, right leg: Secondary | ICD-10-CM | POA: Insufficient documentation

## 2016-04-09 DIAGNOSIS — Z23 Encounter for immunization: Secondary | ICD-10-CM | POA: Diagnosis not present

## 2016-04-09 DIAGNOSIS — X58XXXD Exposure to other specified factors, subsequent encounter: Secondary | ICD-10-CM | POA: Diagnosis not present

## 2016-04-09 DIAGNOSIS — I482 Chronic atrial fibrillation, unspecified: Secondary | ICD-10-CM

## 2016-04-09 DIAGNOSIS — S8991XD Unspecified injury of right lower leg, subsequent encounter: Secondary | ICD-10-CM | POA: Insufficient documentation

## 2016-04-09 DIAGNOSIS — E119 Type 2 diabetes mellitus without complications: Secondary | ICD-10-CM | POA: Diagnosis not present

## 2016-04-09 DIAGNOSIS — N200 Calculus of kidney: Secondary | ICD-10-CM | POA: Diagnosis not present

## 2016-04-09 DIAGNOSIS — S8981XD Other specified injuries of right lower leg, subsequent encounter: Secondary | ICD-10-CM | POA: Diagnosis present

## 2016-04-09 MED ORDER — METFORMIN HCL 1000 MG PO TABS: 1000.0000 mg | ORAL_TABLET | Freq: Two times a day (BID) | ORAL | 3 refills | Status: DC

## 2016-04-09 MED ORDER — LOSARTAN POTASSIUM 25 MG PO TABS: 25.0000 mg | ORAL_TABLET | Freq: Every day | ORAL | 3 refills | Status: DC

## 2016-04-09 MED ORDER — CANAGLIFLOZIN 300 MG PO TABS: 300.0000 mg | ORAL_TABLET | Freq: Every day | ORAL | 3 refills | Status: DC

## 2016-04-09 MED ORDER — RIVAROXABAN 20 MG PO TABS
20.0000 mg | ORAL_TABLET | Freq: Every day | ORAL | 3 refills | Status: DC
Start: 2016-04-09 — End: 2017-06-17

## 2016-04-09 NOTE — Progress Notes (Signed)
Name: Jeremy Kelly.   MRN: [id number]    DOB: 13-[name]-1956   Date:04/09/2016       Progress Note  Subjective  Chief Complaint  Chief Complaint  Patient presents with  . Diabetes  . Atrial Fibrillation    HPI Here for f/u of DM and atr fib.  He has renal stones and had an episode again 2-3 weeks ago.  He has not seen Urologist for several years.  HE checks BSs only rarely.   Gets reading 110-170?Marland Kitchen  No problem-specific Assessment & Plan notes found for this encounter.   Past Medical History:  Diagnosis Date  . Atrial fibrillation (HCC)   . Atrial fibrillation (HCC)   . Diabetes mellitus without complication [facility])     Past Surgical History:  Procedure Laterality Date  . TONSILLECTOMY      Family History  Problem Relation Age of Onset  . Cancer Mother     Social History   Social History  . Marital status: Single    Spouse name: N/A  . Number of children: N/A  . Years of education: N/A   Occupational History  . Not on file.   Social History Main Topics  . Smoking status: Current Every Day Smoker    Types: Cigars  . Smokeless tobacco: Never Used  . Alcohol use No  . Drug use: No  . Sexual activity: Not on file   Other Topics Concern  . Not on file   Social History Narrative  . No narrative on file     Current Outpatient Prescriptions:  .  canagliflozin (INVOKANA) 300 MG TABS tablet, Take 1 tablet (300 mg total) by mouth daily before breakfast., Disp: 90 tablet, Rfl: 3 .  metFORMIN (GLUCOPHAGE) 1000 MG tablet, Take 1 tablet (1,000 mg total) by mouth 2 (two) times daily with a meal., Disp: 180 tablet, Rfl: 3 .  rivaroxaban (XARELTO) 20 MG TABS tablet, Take 1 tablet (20 mg total) by mouth daily with supper., Disp: 90 tablet, Rfl: 3 .  losartan (COZAAR) 25 MG tablet, Take 1 tablet (25 mg total) by mouth daily., Disp: 90 tablet, Rfl: 3  Allergies  Allergen Reactions  . Penicillins      Review of Systems  Constitutional: Negative for chills,  fever, malaise/fatigue and weight loss.  HENT: Negative for hearing loss.   Eyes: Negative for blurred vision and double vision.  Respiratory: Negative for cough, shortness of breath and wheezing.   Cardiovascular: Negative for chest pain and palpitations. Leg swelling: R leg swollen secondary to trauma to leg just below knee.  Gastrointestinal: Negative for abdominal pain, blood in stool and heartburn.  Genitourinary: Negative for dysuria, frequency and urgency.  Musculoskeletal: Negative for back pain and myalgias.  Skin: Negative for rash.  Neurological: Negative for dizziness, tremors, weakness and headaches.      Objective  Vitals:   04/09/16 0900  BP: 135/69  Pulse: (!) 50  Resp: 16  Temp: 97.6 F (36.4 C)  TempSrc: Oral  Weight: 215 lb (97.5 kg)  Height: 5\' 11"  (1.803 m)    Physical Exam  Constitutional: He is oriented to person, place, and time and well-developed, well-nourished, and in no distress. No distress.  HENT:  Head: Normocephalic and atraumatic.  Eyes: Conjunctivae and EOM are normal. Pupils are equal, round, and reactive to light. No scleral icterus.  Neck: Normal range of motion. Neck supple. Carotid bruit is not present. No thyromegaly present.  Cardiovascular: Normal rate and normal heart sounds.  An irregularly irregular rhythm present. Exam reveals no gallop and no friction rub.   No murmur heard. Pulmonary/Chest: Effort normal and breath sounds normal. No respiratory distress. He has no wheezes. He has no rales.  Abdominal: Soft. Bowel sounds are normal. He exhibits no distension and no mass. There is no tenderness.  Musculoskeletal:  Swollen sl tender R tibia, mid shaft.  Lymphadenopathy:    He has no cervical adenopathy.  Neurological: He is alert and oriented to person, place, and time.  Vitals reviewed.      No results found for this or any previous visit (from the past 2160 hour(s)).   Assessment & Plan  Problem List Items Addressed  This Visit      Cardiovascular and Mediastinum   A-fib (HCC)   Relevant Medications   losartan (COZAAR) 25 MG tablet   rivaroxaban (XARELTO) 20 MG TABS tablet   Other Relevant Orders   Ambulatory referral to Cardiology     Endocrine   Diabetes (HCC)   Relevant Medications   losartan (COZAAR) 25 MG tablet   metFORMIN (GLUCOPHAGE) 1000 MG tablet   canagliflozin (INVOKANA) 300 MG TABS tablet     Genitourinary   Renal stones   Relevant Orders   Ambulatory referral to Urology    Other Visit Diagnoses    Diabetes mellitus without complication (HCC)    -  Primary   Relevant Medications   losartan (COZAAR) 25 MG tablet   metFORMIN (GLUCOPHAGE) 1000 MG tablet   canagliflozin (INVOKANA) 300 MG TABS tablet   Other Relevant Orders   HM Diabetes Foot Exam (Completed)   COMPLETE METABOLIC PANEL WITH GFR   Lipid Profile   HgB A1c   Blunt trauma of lower leg, right, subsequent encounter       Relevant Orders   CBC with Differential   DG Tibia/Fibula Right   Immunization due       Relevant Orders   Flu Vaccine QUAD 36+ mos PF IM (Fluarix & Fluzone Quad PF)      Meds ordered this encounter  Medications  . losartan (COZAAR) 25 MG tablet    Sig: Take 1 tablet (25 mg total) by mouth daily.    Dispense:  90 tablet    Refill:  3  . metFORMIN (GLUCOPHAGE) 1000 MG tablet    Sig: Take 1 tablet (1,000 mg total) by mouth 2 (two) times daily with a meal.    Dispense:  180 tablet    Refill:  3  . canagliflozin (INVOKANA) 300 MG TABS tablet    Sig: Take 1 tablet (300 mg total) by mouth daily before breakfast.    Dispense:  90 tablet    Refill:  3  . rivaroxaban (XARELTO) 20 MG TABS tablet    Sig: Take 1 tablet (20 mg total) by mouth daily with supper.    Dispense:  90 tablet    Refill:  3   1. Diabetes mellitus without complication (HCC)  - HM Diabetes Foot Exam - COMPLETE METABOLIC PANEL WITH GFR - Lipid Profile - HgB A1c - losartan (COZAAR) 25 MG tablet; Take 1 tablet (25 mg  total) by mouth daily.  Dispense: 90 tablet; Refill: 3 - metFORMIN (GLUCOPHAGE) 1000 MG tablet; Take 1 tablet (1,000 mg total) by mouth 2 (two) times daily with a meal.  Dispense: 180 tablet; Refill: 3 - canagliflozin (INVOKANA) 300 MG TABS tablet; Take 1 tablet (300 mg total) by mouth daily before breakfast.  Dispense: 90 tablet; Refill: 3  2.  Chronic atrial fibrillation (HCC)  - Ambulatory referral to Cardiology - rivaroxaban (XARELTO) 20 MG TABS tablet; Take 1 tablet (20 mg total) by mouth daily with supper.  Dispense: 90 tablet; Refill: 3  3. Renal stones  - Ambulatory referral to Urology  4. Blunt trauma of lower leg, right, subsequent encounter  - CBC with Differential - DG Tibia/Fibula Right; Future  5. Immunization due  - Flu Vaccine QUAD 36+ mos PF IM (Fluarix & Fluzone Quad PF)  6. Type 2 diabetes mellitus without complication, unspecified long term insulin use status (HCC)  - metFORMIN (GLUCOPHAGE) 1000 MG tablet; Take 1 tablet (1,000 mg total) by mouth 2 (two) times daily with a meal.  Dispense: 180 tablet; Refill: 3

## 2016-04-10 LAB — COMPLETE METABOLIC PANEL WITH GFR
ALBUMIN: 4.5 g/dL (ref 3.6–5.1)
ALK PHOS: 59 U/L (ref 40–115)
ALT: 31 U/L (ref 9–46)
AST: 21 U/L (ref 10–35)
BILIRUBIN TOTAL: 0.8 mg/dL (ref 0.2–1.2)
BUN: 22 mg/dL (ref 7–25)
CALCIUM: 10 mg/dL (ref 8.6–10.3)
CO2: 27 mmol/L (ref 20–31)
CREATININE: 1.28 mg/dL (ref 0.70–1.33)
Chloride: 101 mmol/L (ref 98–110)
GFR, Est African American: 70 mL/min (ref >=60)
GFR, Est Non African American: 61 mL/min (ref >=60)
Glucose, Bld: 175 mg/dL — ABNORMAL HIGH (ref 65–99)
Potassium: 4.9 mmol/L (ref 3.5–5.3)
Sodium: 137 mmol/L (ref 135–146)
TOTAL PROTEIN: 7.6 g/dL (ref 6.1–8.1)

## 2016-04-10 LAB — HEMOGLOBIN A1C
Hgb A1c MFr Bld: 8 % — ABNORMAL HIGH (ref <5.7)
Mean Plasma Glucose: 183 mg/dL

## 2016-04-10 LAB — CBC WITH DIFFERENTIAL/PLATELET
Basophils Absolute: 69 cells/uL (ref 0–200)
Basophils Relative: 1 %
EOS PCT: 3 %
Eosinophils Absolute: 207 cells/uL (ref 15–500)
HEMATOCRIT: 47.6 % (ref 38.5–50.0)
Hemoglobin: 16.2 g/dL (ref 13.2–17.1)
LYMPHS PCT: 21 %
Lymphs Abs: 1449 cells/uL (ref 850–3900)
MCH: 30.9 pg (ref 27.0–33.0)
MCHC: 34 g/dL (ref 32.0–36.0)
MCV: 90.8 fL (ref 80.0–100.0)
MONO ABS: 621 {cells}/uL (ref 200–950)
MONOS PCT: 9 %
MPV: 10.3 fL (ref 7.5–12.5)
NEUTROS PCT: 66 %
Neutro Abs: 4554 cells/uL (ref 1500–7800)
Platelets: 272 10*3/uL (ref 140–400)
RBC: 5.24 MIL/uL (ref 4.20–5.80)
RDW: 13.3 % (ref 11.0–15.0)
WBC: 6.9 10*3/uL (ref 3.8–10.8)

## 2016-04-10 LAB — LIPID PANEL
Cholesterol: 124 mg/dL — ABNORMAL LOW (ref 125–200)
HDL: 47 mg/dL (ref >=40)
LDL CALC: 64 mg/dL (ref <130)
TRIGLYCERIDES: 64 mg/dL (ref <150)
Total CHOL/HDL Ratio: 2.6 Ratio (ref <=5.0)
VLDL: 13 mg/dL (ref <30)

## 2016-04-10 MED ORDER — LIRAGLUTIDE 18 MG/3ML ~~LOC~~ SOPN: 0.6000 mg | PEN_INJECTOR | Freq: Every day | SUBCUTANEOUS | 6 refills | Status: DC

## 2016-04-10 NOTE — Addendum Note (Signed)
Addended by: Alease FrameARTER, Ayodeji Keimig S on: 04/10/2016 02:44 PM   Modules accepted: Orders

## 2016-04-17 ENCOUNTER — Ambulatory Visit: Payer: BLUE CROSS/BLUE SHIELD | Admitting: Cardiology

## 2016-04-27 ENCOUNTER — Ambulatory Visit (INDEPENDENT_AMBULATORY_CARE_PROVIDER_SITE_OTHER): Payer: BLUE CROSS/BLUE SHIELD | Admitting: Cardiovascular Disease

## 2016-04-27 ENCOUNTER — Encounter: Payer: Self-pay | Admitting: Cardiovascular Disease

## 2016-04-27 ENCOUNTER — Telehealth: Payer: Self-pay | Admitting: Family Medicine

## 2016-04-27 VITALS — BP 114/76 | HR 70 | Ht 68.0 in | Wt 216.5 lb

## 2016-04-27 DIAGNOSIS — I491 Atrial premature depolarization: Secondary | ICD-10-CM | POA: Diagnosis not present

## 2016-04-27 DIAGNOSIS — I480 Paroxysmal atrial fibrillation: Secondary | ICD-10-CM | POA: Diagnosis not present

## 2016-04-27 DIAGNOSIS — I499 Cardiac arrhythmia, unspecified: Secondary | ICD-10-CM | POA: Diagnosis not present

## 2016-04-27 DIAGNOSIS — I498 Other specified cardiac arrhythmias: Secondary | ICD-10-CM

## 2016-04-27 DIAGNOSIS — E119 Type 2 diabetes mellitus without complications: Secondary | ICD-10-CM | POA: Diagnosis not present

## 2016-04-27 NOTE — Patient Instructions (Signed)

## 2016-04-27 NOTE — Progress Notes (Signed)
Cardiology Office Note  Date:  04/27/2016   ID:  Jeremy KochBobby Darrell Hinkley Jr., DOB Aug 13, 1955, MRN [id number]  PCP:  Fidel LevyJames Hawkins Jr, MD   Chief Complaint  Patient presents with  . other     Early 10mo f/u due to referral from Dr. Juanetta GoslingHawkins, PCP for chronic Afib on 04/09/16. Pt states he is doing well. Reviewed meds with pt verbally.    HPI:  Jeremy Kelly is a pleasant 60 year old gentleman, DM, notes indicating previous history of paroxysmal atrial fibrillation (not confirmed), who presents for routine follow-up of his atrial fibrillation  On his last clinic visit, we attempted to obtain EKGs, Holter monitors from [place] [facility]. We were unsuccessful in obtaining these results We [facility], EKGs are on "[facility]", old EKGs system. One of the EKGs from June 2014 reportedly showed atrial fibrillation. Unable to confirm this as image not currently available.  Based on this EKG [facility], seen by cardiology Reports having Holter monitor. This was requested (they are closed today). He had stress test followed by Cardiac catheterization,  reviewed by myself showing minimal LAD disease. No discussion of atrial fibrillation. Cardiology notes have been again requested, Dr. Cassie FreerParachos.  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  PSH:    Past Surgical History:  Procedure Laterality Date  . TONSILLECTOMY      Current Outpatient Prescriptions  Medication Sig Dispense Refill  . canagliflozin (INVOKANA) 300 MG TABS tablet Take 1 tablet (300 mg total) by mouth daily before breakfast. 90 tablet 3  . liraglutide (VICTOZA) 18 MG/3ML SOPN Inject 0.1 mLs (0.6 mg total) into the skin daily. 5 pen 6  . losartan (COZAAR) 25 MG tablet Take 1 tablet (25 mg total) by mouth daily. 90 tablet 3  . metFORMIN (GLUCOPHAGE) 1000 MG tablet Take 1 tablet (1,000 mg total) by mouth 2 (two) times daily with a meal. 180 tablet 3  . rivaroxaban (XARELTO) 20 MG TABS tablet Take 1 tablet (20 mg total) by mouth daily with supper. 90 tablet 3   No current facility-administered medications for this visit.      Allergies:   Penicillins   Social History:  The patient  reports that he has been smoking Cigars.  He has never used smokeless tobacco. He reports that he does not drink alcohol or use drugs.   Family History:   family history includes Cancer in his mother.    Review of Systems: Review of Systems  Constitutional: Negative.   Respiratory: Negative.   Cardiovascular: Negative.   Gastrointestinal: Negative.   Musculoskeletal: Negative.   Neurological: Negative.   Psychiatric/Behavioral: Negative.   All other systems reviewed and are negative.    PHYSICAL EXAM: VS:  BP 114/76 (BP Location: Left Arm, Patient Position: Sitting, Cuff Size: Normal)  Pulse 70   Ht 5\' 8"  (1.727 m)   Wt 216 lb 8 oz (98.2 kg)   BMI 32.92 kg/m  , BMI Body mass index is 32.92 kg/m. GEN: Well nourished, well developed, in no acute distress  HEENT: normal  Neck: no JVD, carotid bruits, or masses Cardiac: Regular rhythm, irregularity noted; no murmurs, rubs, or gallops,no edema  Respiratory:  clear to auscultation bilaterally, normal work of breathing GI: soft, nontender, nondistended, + BS MS: no deformity or atrophy   Skin: warm and dry, no rash Neuro:  Strength and sensation are intact Psych: euthymic mood, full affect    Recent Labs: 04/09/2016: ALT 31; BUN 22; Creat 1.28; Hemoglobin 16.2; Platelets 272; Potassium 4.9; Sodium 137    Lipid Panel Lab Results  Component Value Date   CHOL 124 (L) 04/09/2016   HDL 47 04/09/2016   LDLCALC 64 04/09/2016   TRIG 64 04/09/2016      Wt Readings from Last 3 Encounters:  04/27/16 216 lb 8 oz (98.2 kg)  04/09/16 215 lb (97.5 kg)  03/17/16 215 lb (97.5 kg)       ASSESSMENT AND PLAN:  Paroxysmal atrial fibrillation We spent most of today's visit researching his atrial fibrillation history Calls placed to [facility]  unable to visualize EKG from June 2014 reportedly showing atrial fibrillation Will call back again next week when office manager returns. Perhaps we could find paper copy/original.  Also contacted [facility] and requested raw data of Holter monitor The hope would be he does not have any documented atrial fibrillation, All of his EKGs are showing sinus arrhythmia, also will APCs but no atrial fibrillation If this is the case, could stop his anticoagulation  He does have a CHADS VASC of 1, and would be low risk regardless We have suggested we research his data again next week and call him with a final decision concerning anticoagulation   PAC (premature atrial contraction) - Plan: EKG 12-Lead  Sinus arrhythmia Normal sinus rhythm with sinus arrhythmia noted on last several EKGs  Type 2 diabetes mellitus without complication, without long-term current use of insulin (HCC) We have encouraged continued exercise, careful diet management in an effort to lose weight.    Disposition:   F/U  12 months   Orders Placed This Encounter  Procedures  . EKG 12-Lead     Signed, Dossie Arbourim Gollan, M.D., Ph.D. 04/27/2016  [facility], [place] [phone]

## 2016-04-27 NOTE — Telephone Encounter (Signed)
Called by Cardiology for any old EKGs/Stress Tests  [place]. However, we cannot see the tracing, only the report. Report copied and sent below. No images available on computer.   Has seen Dr. Cassie FreerParachos in the past in 2014.   Sorry we don't have anything else at this time.   ID: [id number] Jeremy Kelly Chestang   DOB: 1955-09-23 Date: 12/04/12 : 10:21am Title: EKG .PV: ADM  EKG report:  Heart Rate: 111 bpm  P - R wave interval: 0 mSec QT interval: 318 mSec Corrected QT interval: 407 mSec QRS width: 94 mSec  QRS complex frontal axis: -14  T wave frontal axis: 45   Interpretation:  Atrial fibrillation  ABNORMAL RHYTHM   ID: [id number] Jeremy Evener- Markees Raynes   DOB: 1955-09-23 Date: 08/25/12 : 3:45pm Title: EKG .PV: CW  EKG report:  Heart Rate: 64 bpm  P - R wave interval: 160 mSec QT interval: 396 mSec Corrected QT interval: 404 mSec QRS width: 104 mSec  P wave frontal axis: 35  QRS complex frontal axis: -11  T wave frontal axis: 21   Interpretation:  Sinus Rhythm -Frequent PACs -atrial bigeminy  -RSR(V1) -nondiagnostic . BORDERLINE RHYTHM    Patient Data: Blood pressure: 142/90 Weight: 235 lb Height: 71 in Age: [age]   Report Date: 08/25/12 Report Time: 15:45:01 Review Date: Review Time:  Review By:  Physician: CW  Technician: #        SIGNED BY Elnita MaxwellHERYL A WICKER, CFNP  (CW)       08/25/2012 04:44PM

## 2016-04-29 NOTE — Telephone Encounter (Signed)
Hi Jeremy Kelly, Are there paper copies of the old EKGs? In particular 11/2012 (atrial fib) He is on anticoagulation based sorely on that EKG I believe. [facility] to obtain [facility]ter [facility]. He has sinus arrhythmia (for years), often mistaken for atrial fib. THX TGollan

## 2016-04-30 NOTE — Telephone Encounter (Signed)
[facility] Given to Dr. Mariah MillingGollan for review.

## 2016-04-30 NOTE — Telephone Encounter (Signed)
[facility] to see if they can fax over any originals. Per receptionist, this is prior to electronic records and they will have to search their archives.

## 2016-04-30 NOTE — Telephone Encounter (Signed)
I am having my Print production plannerffice manager look into this. I am so sorry for the inconvenience. I hope to have a better response for you by the end of the day.

## 2016-05-01 NOTE — Telephone Encounter (Signed)
Dr. Mariah MillingGollan [facility]. No documentation of afib. Pt was started on blood thinner based on EKG diagnosis on June 2014. Is there anyway we can obtain this EKG in particular? If not, he may stop pt's Xarelto, as afib has not been seen on any other testing.

## 2016-05-01 NOTE — Telephone Encounter (Signed)
Thank you so much for the EKG.   Is there anyway the original can be scanned in to [facility]? The faxed copy is greatly appreciated, but a bit hard to read.

## 2016-05-01 NOTE — Telephone Encounter (Signed)
They are scanning them now.

## 2016-05-01 NOTE — Telephone Encounter (Signed)
Looks like our technical issue just got fixed. We have faxed over a copy of both the EKGs we have on file for him. Thank you so much and I'm sorry for the delay.

## 2016-05-06 NOTE — Telephone Encounter (Signed)
EKG from 11/2012 does confirm atrial fibrillation Would encourage him to stay on anticoaguation. thx Maris BergerGollan

## 2016-05-07 NOTE — Telephone Encounter (Signed)
Spoke w/ pt.  Advised him of Dr. Windell HummingbirdGollan's recommendation.  He is agreeable and will continue his Xarelto.

## 2017-06-05 ENCOUNTER — Ambulatory Visit: Payer: BLUE CROSS/BLUE SHIELD | Admitting: Nurse Practitioner

## 2017-06-05 VITALS — BP 142/72 | HR 78 | Resp 15 | Ht 68.0 in | Wt 220.4 lb

## 2017-06-05 DIAGNOSIS — E1165 Type 2 diabetes mellitus with hyperglycemia: Secondary | ICD-10-CM | POA: Diagnosis not present

## 2017-06-05 LAB — POCT GLYCOSYLATED HEMOGLOBIN (HGB A1C): Hemoglobin A1C: 8.1

## 2017-06-05 MED ORDER — SEMAGLUTIDE(0.25 OR 0.5MG/DOS) 2 MG/1.5ML ~~LOC~~ SOPN: 0.2500 mg | PEN_INJECTOR | SUBCUTANEOUS | 2 refills | Status: DC

## 2017-06-05 NOTE — Patient Instructions (Addendum)
Reita ClicheBobby, Thank you for coming in to clinic today.  1. Your provider would like to you have your annual eye exam. Please contact your current eye doctor or here are some good options for you to contact.   [facility]         Address: [address], [place], [place] [address]    Phone: [phone]       Website: [email or web address]       [facility] Address: [address], [place], [place] [address]  Phone: [phone]  Website: [email or web address]   2. START Ozempic instead of Victoza. - Take 0.25 mg once weekly on the same day of the week for 4 weeks. - Then, increase to 0.5 mg once weekly on the same day of the week and continue. - If you have constipation, stop taking this medicine and call the clinic so we can start the other oral medication (Januvia) we had discussed at your appointment.  Please schedule a follow-up appointment with Wilhelmina McardleLauren Letitia Sabala, AGNP. Return in about 3 months (around 09/03/2017) for diabetes.  If you have any other questions or concerns, please feel free to call the clinic or send a message through MyChart. You may also schedule an earlier appointment if necessary.  You will receive a survey after today's visit either digitally by e-mail or paper by [employer] mail. Your experiences and feedback matter to us.  Please respond so we know how we are doing as we provide care for you.   Wilhelmina McardleLauren Lacinda Curvin, DNP, AGNP-BC Adult Gerontology Nurse Practitioner [facility] [place] [facility], [facility]

## 2017-06-05 NOTE — Progress Notes (Signed)
Subjective:    Patient ID: Jeremy KochBobby Darrell Strough Jr., male    DOB: [birth year], 61 y.o.   MRN: [id number]  Jeremy KochBobby Darrell Heinzelman Jr. is a 61 y.o. male presenting on 06/05/2017 for Diabetes   HPI Diabetes Pt presents today for follow up of Type 2 diabetes mellitus. He is checking fasting am CBG at home with a range of 180-200, but only checking intermittently. - Current diabetic medications include: metformin and invokana.  Pt reports Victoza previously used, but caused significant constipation. - He is not currently symptomatic.  - He denies polydipsia, polyphagia, polyuria, headaches, diaphoresis, shakiness, chills, pain, numbness or tingling in extremities and changes in vision.   - Clinical course has been stable. - He  reports no regular exercise routine. - His diet is moderate in salt, moderate in fat, and moderate in carbohydrates. - Weight trend: stable  PREVENTION: Eye exam current (within one year): no - Plans for making an appointment. Foot exam current (within one year): no Lipid/ASCVD risk reduction - on statin: none Kidney protection - on ace or arb: no Recent Labs    06/05/17 1115  HGBA1C 8.1    Social History   Tobacco Use  . Smoking status: Current Every Day Smoker    Types: Cigars  . Smokeless tobacco: Never Used  . Tobacco comment: 7 cigars a day  Substance Use Topics  . Alcohol use: No  . Drug use: No    Review of Systems Per HPI unless specifically indicated above     Objective:    BP (!) 142/72 (BP Location: Left Arm, Patient Position: Sitting, Cuff Size: Normal)   Pulse 78   Resp 15   Ht 5\' 8"  (1.727 m)   Wt 220 lb 6.4 oz (100 kg)   BMI 33.51 kg/m   Wt Readings from Last 3 Encounters:  06/05/17 220 lb 6.4 oz (100 kg)  04/27/16 216 lb 8 oz (98.2 kg)  04/09/16 215 lb (97.5 kg)    Physical Exam  General - overweight, well-appearing, NAD HEENT - Normocephalic, atraumatic Neck - supple, non-tender, no LAD, no thyromegaly, no carotid  bruit Heart - RRR, no murmurs heard Lungs - Clear throughout all lobes, no wheezing, crackles, or rhonchi. Normal work of breathing. Extremeties - non-tender, no edema, cap refill < 2 seconds, peripheral pulses intact +2 bilaterally Skin - warm, dry Neuro - awake, alert, oriented x3, normal gait Psych - Normal mood and affect, normal behavior     Results for orders placed or performed in visit on 06/05/17  POCT HgB A1C  Result Value Ref Range   Hemoglobin A1C 8.1       Assessment & Plan:   Problem List Items Addressed This Visit      Endocrine   Diabetes (HCC) - Primary    Currently uncontrolled and above goal of less than 7.0%.  Patient on metformin and Invokana.  Patient previously on Victoza which was not well tolerated.  Patient is still willing to try injectable GLP-1 antagonist.  Plan: 1.  Start Ozempic.  Inject 0.25 mg once weekly for 4 weeks.  Then, inject 0.5 mg and continue. 2. Continue metformin and invokana without changes. 3. Increase physical activity to 30 minutes most days of the week. 4. Encouraged low glycemic diet - handout provided. 5. Followup 3 months.      Relevant Medications   Semaglutide (OZEMPIC) 0.25 or 0.5 MG/DOSE SOPN   Other Relevant Orders   POCT HgB A1C (Completed)   Comprehensive metabolic  panel   Lipid panel      Meds ordered this encounter  Medications  . Semaglutide (OZEMPIC) 0.25 or 0.5 MG/DOSE SOPN    Sig: Inject 0.25 mg into the skin once a week. For 4 weeks, then inject 0.5 mg into the skin once a week and continue at this dose.    Dispense:  1 pen    Refill:  2    Order Specific Question:   Supervising Provider    Answer:   Smitty CordsKARAMALEGOS, ALEXANDER J [[id number]]      Follow up plan: Return in about 3 months (around 09/03/2017) for diabetes.  Wilhelmina McardleLauren Crissa Sowder, DNP, AGPCNP-BC Adult Gerontology Primary Care Nurse Practitioner [facility] [place] [facility] 06/09/2017, 11:39 PM

## 2017-06-09 ENCOUNTER — Encounter: Payer: Self-pay | Admitting: Nurse Practitioner

## 2017-06-09 NOTE — Assessment & Plan Note (Signed)
Currently uncontrolled and above goal of less than 7.0%.  Patient on metformin and Invokana.  Patient previously on Victoza which was not well tolerated.  Patient is still willing to try injectable GLP-1 antagonist.  Plan: 1.  Start Ozempic.  Inject 0.25 mg once weekly for 4 weeks.  Then, inject 0.5 mg and continue. 2. Continue metformin and invokana without changes. 3. Increase physical activity to 30 minutes most days of the week. 4. Encouraged low glycemic diet - handout provided. 5. Followup 3 months.

## 2017-06-17 ENCOUNTER — Ambulatory Visit: Payer: BLUE CROSS/BLUE SHIELD | Admitting: Physician Assistant

## 2017-06-17 ENCOUNTER — Encounter: Payer: Self-pay | Admitting: *Deleted

## 2017-06-17 ENCOUNTER — Other Ambulatory Visit
Admission: RE | Admit: 2017-06-17 | Discharge: 2017-06-17 | Disposition: A | Discharge status: Home / Self Care | Payer: BLUE CROSS/BLUE SHIELD | Source: Ambulatory Visit | Attending: Physician Assistant | Admitting: Physician Assistant

## 2017-06-17 ENCOUNTER — Encounter: Payer: Self-pay | Admitting: Physician Assistant

## 2017-06-17 VITALS — BP 124/70 | HR 66 | Ht 68.0 in | Wt 218.0 lb

## 2017-06-17 DIAGNOSIS — E119 Type 2 diabetes mellitus without complications: Secondary | ICD-10-CM

## 2017-06-17 DIAGNOSIS — I480 Paroxysmal atrial fibrillation: Secondary | ICD-10-CM

## 2017-06-17 DIAGNOSIS — Z79899 Other long term (current) drug therapy: Secondary | ICD-10-CM

## 2017-06-17 DIAGNOSIS — Z13220 Encounter for screening for lipoid disorders: Secondary | ICD-10-CM

## 2017-06-17 DIAGNOSIS — Z0000 Encounter for general adult medical examination without abnormal findings: Secondary | ICD-10-CM | POA: Diagnosis present

## 2017-06-17 LAB — BASIC METABOLIC PANEL
Anion gap: 10 (ref 5–15)
BUN: 22 mg/dL — ABNORMAL HIGH (ref 6–20)
CALCIUM: 9.5 mg/dL (ref 8.9–10.3)
CHLORIDE: 107 mmol/L (ref 101–111)
CO2: 21 mmol/L — ABNORMAL LOW (ref 22–32)
Creatinine, Ser: 1.25 mg/dL — ABNORMAL HIGH (ref 0.61–1.24)
GFR calc non Af Amer: >60 mL/min (ref >60)
Glucose, Bld: 142 mg/dL — ABNORMAL HIGH (ref 65–99)
Potassium: 4.2 mmol/L (ref 3.5–5.1)
Sodium: 138 mmol/L (ref 135–145)

## 2017-06-17 LAB — CBC WITH DIFFERENTIAL/PLATELET
Basophils Absolute: 0.1 10*3/uL (ref 0–0.1)
Basophils Relative: 1 %
Eosinophils Absolute: 0.2 10*3/uL (ref 0–0.7)
Eosinophils Relative: 2 %
HEMATOCRIT: 47.6 % (ref 40.0–52.0)
HEMOGLOBIN: 16.1 g/dL (ref 13.0–18.0)
LYMPHS ABS: 1.3 10*3/uL (ref 1.0–3.6)
LYMPHS PCT: 18 %
MCH: 31.3 pg (ref 26.0–34.0)
MCHC: 33.9 g/dL (ref 32.0–36.0)
MCV: 92.4 fL (ref 80.0–100.0)
MONOS PCT: 8 %
Monocytes Absolute: 0.6 10*3/uL (ref 0.2–1.0)
NEUTROS ABS: 5.2 10*3/uL (ref 1.4–6.5)
NEUTROS PCT: 71 %
Platelets: 207 10*3/uL (ref 150–440)
RBC: 5.15 MIL/uL (ref 4.40–5.90)
RDW: 13.6 % (ref 11.5–14.5)
WBC: 7.4 10*3/uL (ref 3.8–10.6)

## 2017-06-17 LAB — TSH: TSH: 1.368 u[IU]/mL (ref 0.350–4.500)

## 2017-06-17 LAB — LIPID PANEL
CHOL/HDL RATIO: 2.8 ratio
Cholesterol: 133 mg/dL (ref 0–200)
HDL: 47 mg/dL (ref >40)
LDL Cholesterol: 60 mg/dL (ref 0–99)
TRIGLYCERIDES: 130 mg/dL (ref <150)
VLDL: 26 mg/dL (ref 0–40)

## 2017-06-17 LAB — MAGNESIUM: Magnesium: 2.5 mg/dL — ABNORMAL HIGH (ref 1.7–2.4)

## 2017-06-17 MED ORDER — RIVAROXABAN 20 MG PO TABS: 20.0000 mg | ORAL_TABLET | Freq: Every day | ORAL | 3 refills | Status: DC

## 2017-06-17 NOTE — Progress Notes (Signed)
Cardiology Office Note Date:  06/17/2017  Patient ID:  [name], DOB 09-03-1955, MRN [id number] PCP:  Patient, No Pcp Per  Cardiologist:  Dr. Mariah MillingGollan, MD    Chief Complaint: Follow up Afib  History of Present Illness: Jeremy ChildBobby Darrell Adela LankKeck Jr. is a 62 y.o. male with [facility], PAF diagnosed in 11/2012 on EKG on Xarelto and poorly controlled DM. Patient was apparently being seen by his PCP in 11/2012 when on exam he was noted to have an irregular rhythm. EKG was performed at that time that documented Afib (we now have that EKG scanned in for review). He was initially placed on Praxada, but has since been transitioned to Xarelto. He was asymptomatic at the time of his diagnosis. He has not had any symptoms concerning for arrhythmia since. At that time, he underwent Holter that showed NSR with sinus arrhythmia and sinus bradycardia. LHC in 11/2012 showed normal coronary arteries, EF 61%. He was most recently seen by Dr. Mariah MillingGollan in 04/2016 and was doing well at that time. No changes were made. Most recent A1c of 8.1 in 05/2017. He comes in doing well today. Tolerating Xarelto without issues. No symptoms concerning for recurrence of Afib.    Past Medical History:  Diagnosis Date  . Diabetes mellitus with complication (HCC)   . PAF (paroxysmal atrial fibrillation) (HCC)    a. noted on EKG 11/2012; b. CHADS2VASc => 1 (DM)    Past Surgical History:  Procedure Laterality Date  . TONSILLECTOMY      Current Meds  Medication Sig  . canagliflozin (INVOKANA) 300 MG TABS tablet Take 1 tablet (300 mg total) by mouth daily before breakfast.  . metFORMIN (GLUCOPHAGE) 1000 MG tablet Take 1 tablet (1,000 mg total) by mouth 2 (two) times daily with a meal.  . rivaroxaban (XARELTO) 20 MG TABS tablet Take 1 tablet (20 mg total) by mouth daily with supper.  . [DISCONTINUED] rivaroxaban (XARELTO) 20 MG TABS tablet Take 1 tablet (20 mg total) by mouth daily with  supper.    Allergies:   Penicillins   Social History:  The patient  reports that he has been smoking cigars.  he has never used smokeless tobacco. He reports that he does not drink alcohol or use drugs.   Family History:  The patient's family history includes Cancer in his mother.  ROS:   Review of Systems  Constitutional: Negative for chills, diaphoresis, fever, malaise/fatigue and weight loss.  HENT: Negative for congestion.   Eyes: Negative for discharge and redness.  Respiratory: Negative for cough, hemoptysis, sputum production, shortness of breath and wheezing.   Cardiovascular: Negative for chest pain, palpitations, orthopnea, claudication, leg swelling and PND.  Gastrointestinal: Negative for abdominal pain, blood in stool, heartburn, melena, nausea and vomiting.  Genitourinary: Negative for hematuria.  Musculoskeletal: Negative for falls and myalgias.  Skin: Negative for rash.  Neurological: Negative for dizziness, tingling, tremors, sensory change, speech change, focal weakness, loss of consciousness and weakness.  Endo/Heme/Allergies: Does not bruise/bleed easily.  Psychiatric/Behavioral: Negative for substance abuse. The patient is not nervous/anxious.   All other systems reviewed and are negative.    PHYSICAL EXAM:  VS:  BP 124/70 (BP Location: Left Arm, Patient Position: Sitting, Cuff Size: Normal)   Pulse 66   Ht 5\' 8"  (1.727 m)   Wt 218 lb (98.9 kg)   BMI 33.15 kg/m  BMI: Body mass index is 33.15 kg/m.  Physical Exam  Constitutional: He is  oriented to person, place, and time. He appears well-developed and well-nourished.  HENT:  Head: Normocephalic and atraumatic.  Eyes: Right eye exhibits no discharge. Left eye exhibits no discharge.  Neck: Normal range of motion. No JVD present.  Cardiovascular: Normal rate, regular rhythm, S1 normal, S2 normal and normal heart sounds. Exam reveals no distant heart sounds, no friction rub, no midsystolic click and no opening  snap.  No murmur heard. Pulmonary/Chest: Effort normal and breath sounds normal. No respiratory distress. He has no decreased breath sounds. He has no wheezes. He has no rales. He exhibits no tenderness.  Abdominal: Soft. He exhibits no distension. There is no tenderness.  Musculoskeletal: He exhibits no edema.  Neurological: He is alert and oriented to person, place, and time.  Skin: Skin is warm and dry. No cyanosis. Nails show no clubbing.  Psychiatric: He has a normal mood and affect. His speech is normal and behavior is normal. Judgment and thought content normal.     EKG:  Was ordered and interpreted by me today. Shows NSR, 66 bpm, sinus arrhythmia, no acute st/t changes. EKG from 11/2012 was reviewed that shows Afib.   Recent Labs: No results found for requested labs within last 8760 hours.  No results found for requested labs within last 8760 hours.   CrCl cannot be calculated (Patient's most recent lab result is older than the maximum 21 days allowed.).   Wt Readings from Last 3 Encounters:  06/17/17 218 lb (98.9 kg)  06/05/17 220 lb 6.4 oz (100 kg)  04/27/16 216 lb 8 oz (98.2 kg)     Other studies reviewed: Additional studies/records reviewed today include: summarized above  ASSESSMENT AND PLAN:  1. PAF: Doing well. EKG from 11/2012 was reviewed that confirms diagnosis of Afib. He is tolerating Xarelto without issues. He has a CHADS2VASc of 1 (DM) at this time. Given his risk factor of DM, we will continue DOAC. 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 He is not on any rate-controlling medications at this time. I will check routine labs including cmet, magnesium, tsh, cbc, lipid, d-ldl.   2. DM: Managed by PCP.   Disposition: F/u with Dr. Mariah Milling in 12 months.   Current medicines are reviewed at length with the patient today.  The patient did not have any concerns regarding medicines.  Elinor Dodge PA-C 06/17/2017 4:23 PM     [facility] [place] [address] [place], [place] [address] [phone]

## 2017-06-17 NOTE — Patient Instructions (Signed)
Medication Instructions:  Your physician recommends that you continue on your current medications as directed. Please refer to the Current Medication list given to you today.   Labwork: Your physician recommends that you return for lab work in: TODAY (CBC, BMET, TSH, MG, LIPID, DIRECT LDL.)  - Please go to the [facility]. You will check in at the front desk to the right as you walk into the atrium. Valet Parking is offered if needed.    Testing/Procedures: none  Follow-Up: Your physician wants you to follow-up in: 12 MONTHS WITH DR Mariah MillingGOLLAN. You will receive a reminder letter in the mail two months in advance. If you don't receive a letter, please call our office to schedule the follow-up appointment.   If you need a refill on your cardiac medications before your next appointment, please call your pharmacy.

## 2017-06-18 ENCOUNTER — Ambulatory Visit: Payer: BLUE CROSS/BLUE SHIELD | Admitting: Cardiovascular Disease

## 2017-06-18 LAB — LDL CHOLESTEROL, DIRECT: LDL DIRECT: 67 mg/dL (ref 0–99)

## 2017-06-21 ENCOUNTER — Telehealth: Payer: Self-pay | Admitting: Cardiovascular Disease

## 2017-06-21 NOTE — Telephone Encounter (Signed)
Copy of this [facility] at [phone]. Confirmation received.

## 2017-06-21 NOTE — Telephone Encounter (Signed)
To Dr. Cyndie ChimeGollan/ Ryan to review.

## 2017-06-21 NOTE — Telephone Encounter (Signed)
Can hold Xarelto x 2 days prior to procedure. Dental extraction by itself typically does not require holding of DOAC. However, given bone grafting, if provider needs DAOC held, can hold as above. Restart DOAC per treating MD.

## 2017-06-21 NOTE — Telephone Encounter (Signed)
° °  [facility] Pre-operative Risk Assessment    Request for surgical clearance:  1. What type of surgery is being performed? Ext tooth #5 w/ bone grafting   2. When is this surgery scheduled? Unknown   3. Are there any medications that need to be held prior to surgery and how long? Xarelto Pre op   4. Practice name and name of physician performing surgery? [facility]   5. What is your office phone and fax number? [phone] fax [phone]  6. Anesthesia type (None, local, MAC, general) ? local    _________________________________________________________________   (provider comments below)

## 2017-08-14 ENCOUNTER — Other Ambulatory Visit: Payer: Self-pay

## 2017-08-14 DIAGNOSIS — E119 Type 2 diabetes mellitus without complications: Secondary | ICD-10-CM

## 2017-08-14 MED ORDER — CANAGLIFLOZIN 300 MG PO TABS: 300.0000 mg | ORAL_TABLET | Freq: Every day | ORAL | 0 refills | Status: DC

## 2017-09-17 ENCOUNTER — Other Ambulatory Visit: Payer: Self-pay

## 2017-09-18 ENCOUNTER — Other Ambulatory Visit: Payer: Self-pay

## 2017-09-18 DIAGNOSIS — E119 Type 2 diabetes mellitus without complications: Secondary | ICD-10-CM

## 2017-09-18 MED ORDER — METFORMIN HCL 1000 MG PO TABS: 1000.0000 mg | ORAL_TABLET | Freq: Two times a day (BID) | ORAL | 1 refills | Status: DC

## 2017-09-18 MED ORDER — SEMAGLUTIDE(0.25 OR 0.5MG/DOS) 2 MG/1.5ML ~~LOC~~ SOPN
0.5000 mg | PEN_INJECTOR | SUBCUTANEOUS | 2 refills | Status: DC
Start: 2017-09-18 — End: 2018-06-09

## 2017-09-18 NOTE — Telephone Encounter (Signed)
Last Rx from 2017.

## 2017-11-07 ENCOUNTER — Other Ambulatory Visit: Payer: Self-pay | Admitting: Nurse Practitioner

## 2017-11-07 DIAGNOSIS — E119 Type 2 diabetes mellitus without complications: Secondary | ICD-10-CM

## 2018-03-02 ENCOUNTER — Other Ambulatory Visit: Payer: Self-pay | Admitting: Nurse Practitioner

## 2018-03-02 DIAGNOSIS — E119 Type 2 diabetes mellitus without complications: Secondary | ICD-10-CM

## 2018-06-05 ENCOUNTER — Ambulatory Visit: Payer: BLUE CROSS/BLUE SHIELD | Admitting: Nurse Practitioner

## 2018-06-06 ENCOUNTER — Ambulatory Visit: Payer: BLUE CROSS/BLUE SHIELD | Admitting: Nurse Practitioner

## 2018-06-06 ENCOUNTER — Other Ambulatory Visit: Payer: Self-pay | Admitting: Nurse Practitioner

## 2018-06-06 DIAGNOSIS — E119 Type 2 diabetes mellitus without complications: Secondary | ICD-10-CM

## 2018-06-09 ENCOUNTER — Encounter: Payer: Self-pay | Admitting: Nurse Practitioner

## 2018-06-09 ENCOUNTER — Telehealth: Payer: Self-pay | Admitting: Nurse Practitioner

## 2018-06-09 ENCOUNTER — Ambulatory Visit: Payer: BLUE CROSS/BLUE SHIELD | Admitting: Nurse Practitioner

## 2018-06-09 ENCOUNTER — Other Ambulatory Visit: Payer: Self-pay

## 2018-06-09 VITALS — BP 134/89 | HR 54 | Temp 97.9°F | Ht 68.0 in | Wt 218.2 lb

## 2018-06-09 DIAGNOSIS — N202 Calculus of kidney with calculus of ureter: Secondary | ICD-10-CM

## 2018-06-09 DIAGNOSIS — E1165 Type 2 diabetes mellitus with hyperglycemia: Secondary | ICD-10-CM

## 2018-06-09 DIAGNOSIS — Z23 Encounter for immunization: Secondary | ICD-10-CM

## 2018-06-09 LAB — POCT GLYCOSYLATED HEMOGLOBIN (HGB A1C): Hemoglobin A1C: 11.9 % — AB (ref 4.0–5.6)

## 2018-06-09 MED ORDER — SITAGLIPTIN PHOSPHATE 100 MG PO TABS: 100.0000 mg | ORAL_TABLET | Freq: Every day | ORAL | 1 refills | Status: DC

## 2018-06-09 MED ORDER — TAMSULOSIN HCL 0.4 MG PO CAPS: ORAL_CAPSULE | ORAL | 2 refills | Status: DC

## 2018-06-09 MED ORDER — CANAGLIFLOZIN 300 MG PO TABS: ORAL_TABLET | ORAL | 1 refills | Status: DC

## 2018-06-09 MED ORDER — IBUPROFEN 800 MG PO TABS: 800.0000 mg | ORAL_TABLET | Freq: Three times a day (TID) | ORAL | 0 refills | Status: AC | PRN

## 2018-06-09 NOTE — Telephone Encounter (Signed)
Pt asked to have 800 mg ibuprofen for pain for kidney stones.  His call back number is [phone]

## 2018-06-09 NOTE — Progress Notes (Signed)
Subjective:    Patient ID: Jeremy KochBobby Darrell Rebman Jr., male    DOB: 18-Apr-1956, 62 y.o.   MRN: [id number]  Jeremy KochBobby Darrell Slagel Jr. is a 62 y.o. male presenting on 06/09/2018 for Diabetes (elevated blood sugars averaging over 200's. Pt been off of his Invokana 300mg  x 2mths)  HPI Diabetes Pt presents today for follow up of Type 2 diabetes mellitus. He is checking fasting am CBG at home with a range of > 200 in last week (230-250) - Current diabetic medications include: metformin 1000 mg twice daily. - Ozempic caused constipation, nausea/fullness. - Off invokana x 2 months approx 04/02/2018 - prior to change, CBGs were infrequently checked but were ranging 150-200. - He is not currently symptomatic.  - He denies polydipsia, polyphagia, polyuria, headaches, diaphoresis, shakiness, chills, pain, numbness or tingling in extremities and changes in vision.   - Clinical course has been worsening. - He  reports no regular exercise routine. - His diet is high in salt, high in fat, and high in carbohydrates.  Eats very few sweets, drinks very little sugar in diet.   Notes frequent meals out with traveling lifestyle for work.  Discussed he didn't get meds filled because he could not travel back to [place] to get them.  Is due soon for DOT physical renewal. - Weight trend: stable  PREVENTION: Eye exam current (within one year): no Foot exam current (within one year): no  Lipid/ASCVD risk reduction - on statin: no - patient refuses Kidney protection - on ace or arb: no Recent Labs    06/09/18 0914  HGBA1C 11.9*   06/05/2017 - 8.1%  Social History   Tobacco Use  . Smoking status: Current Every Day Smoker    Types: Cigars  . Smokeless tobacco: Never Used  . Tobacco comment: 3-4 cigars a day  Substance Use Topics  . Alcohol use: No  . Drug use: No    Review of Systems Per HPI unless specifically indicated above     Objective:    BP 134/89 (BP Location: Right Arm, Patient Position: Sitting,  Cuff Size: Normal)   Pulse (!) 54   Temp 97.9 F (36.6 C) (Oral)   Ht 5\' 8"  (1.727 m)   Wt 218 lb 3.2 oz (99 kg)   BMI 33.18 kg/m   Wt Readings from Last 3 Encounters:  06/09/18 218 lb 3.2 oz (99 kg)  06/17/17 218 lb (98.9 kg)  06/05/17 220 lb 6.4 oz (100 kg)    Physical Exam Vitals signs reviewed.  Constitutional:      General: He is awake. He is not in acute distress.    Appearance: He is well-developed.  HENT:     Head: Normocephalic and atraumatic.  Neck:     Musculoskeletal: Normal range of motion and neck supple.     Vascular: No carotid bruit.  Cardiovascular:     Rate and Rhythm: Normal rate and regular rhythm.     Pulses:          Radial pulses are 2+ on the right side and 2+ on the left side.       Posterior tibial pulses are 1+ on the right side and 1+ on the left side.     Heart sounds: Normal heart sounds, S1 normal and S2 normal.  Pulmonary:     Effort: Pulmonary effort is normal. No respiratory distress.     Breath sounds: Normal breath sounds and air entry.  Abdominal:     General: Bowel sounds  are normal. There is no distension.     Palpations: Abdomen is soft.     Tenderness: There is no abdominal tenderness.     Hernia: No hernia is present.  Skin:    General: Skin is warm and dry.     Capillary Refill: Capillary refill takes less than 2 seconds.  Neurological:     General: No focal deficit present.     Mental Status: He is alert and oriented to person, place, and time. Mental status is at baseline.  Psychiatric:        Attention and Perception: Attention normal.        Mood and Affect: Mood and affect normal.        Behavior: Behavior normal. Behavior is cooperative.      Results for orders placed or performed in visit on 06/09/18  POCT glycosylated hemoglobin (Hb A1C)  Result Value Ref Range   Hemoglobin A1C 11.9 (A) 4.0 - 5.6 %   HbA1c POC (<> result, manual entry)     HbA1c, POC (prediabetic range)     HbA1c, POC (controlled diabetic  range)        Assessment & Plan:   Problem List Items Addressed This Visit      Endocrine   Uncontrolled type 2 diabetes mellitus with hyperglycemia, without long-term current use of insulin (HCC) - Primary UncontrolledDM with A1c 11.9% Worsening control from 8.1% 1 year ago and goal A1c < 7.0%. - Complications - hyperglycemia.  Plan:  1. Change therapy:  - START Januvia 100 mg once daily - this will help keep your insulin active longer. - RESUME your Invokana 300 mg once daily. - CONTINUE metformin 1000 mg one tablet twice daily. 2. Encourage improved lifestyle: - low carb/low glycemic diet reinforced prior education, handout provided, reviewed substitutions that can be made at some restaurants - Increase physical activity to 30 minutes most days of the week.  Explained that increased physical activity increases body's use of sugar for energy. 3. Check fasting am CBG and bring log to next visit for review 4. Consider adding ACEi and Statin 5. Advised to schedule DM ophtho exam, send record.  6. Follow-up 3 months. May extend to 4 months if timing prevents [facility].   Relevant Medications   canagliflozin (INVOKANA) 300 MG TABS tablet   sitaGLIPtin (JANUVIA) 100 MG tablet    Other Visit Diagnoses    Needs flu shot     Pt < age 61.  Needs annual influenza vaccine.  Plan: 1. Administer Quad flu vaccine.   Relevant Orders   Flu Vaccine QUAD 6+ mos PF IM (Fluarix Quad PF) (Completed)   Need for 23-polyvalent pneumococcal polysaccharide vaccine     Indicated for DMT2.  Patient agrees to receive today.   Relevant Orders   Pneumococcal polysaccharide vaccine 23-valent greater than or equal to 2yo subcutaneous/IM (Completed)      Meds ordered this encounter  Medications  . canagliflozin (INVOKANA) 300 MG TABS tablet    Sig: Take 1 tablet (300 mg total) by mouth daily before breakfast.    Dispense:  90 tablet    Refill:  1    Order Specific Question:   Supervising  Provider    Answer:   Smitty CordsKARAMALEGOS, ALEXANDER J [[id number]]  . sitaGLIPtin (JANUVIA) 100 MG tablet    Sig: Take 1 tablet (100 mg total) by mouth daily.    Dispense:  90 tablet    Refill:  1    Order Specific  Question:   Supervising Provider    Answer:   Smitty Cords [[id number]]    Follow up plan: Return in about 3 months (around 09/08/2018) for diabetes.  [name], DNP, AGPCNP-BC Adult Gerontology Primary Care Nurse Practitioner [facility] 06/09/2018, 9:06 AM

## 2018-06-09 NOTE — Telephone Encounter (Signed)
Incoming call

## 2018-06-09 NOTE — Telephone Encounter (Signed)
Started having kidney stones years ago.  Has another kidney stone at this time.  Has mild pain and has used ibuprofen 800 mg once daily.

## 2018-06-09 NOTE — Patient Instructions (Addendum)
Pleas Koch.,   Thank you for coming in to clinic today.  1. For diabetes:  START Januvia 100 mg once daily - this will help keep your insulin active longer. RESUME your Invokana 300 mg once daily. CONTINUE metformin 1000 mg one tablet twice daily. - Work to reduce french fries and white bread.  Please schedule a follow-up appointment with [name], AGNP. Return in about 3 months (around 09/08/2018) for diabetes.  If you have any other questions or concerns, please feel free to call the clinic or send a message through MyChart. You may also schedule an earlier appointment if necessary.  You will receive a survey after today's visit either digitally by e-mail or paper by [employer]. Your experiences and feedback matter to [place].  Please respond so we know how we are doing as we provide care for you.   [name], DNP, AGNP-BC Adult Gerontology Nurse Practitioner [facility]    Influenza (Flu) Vaccine (Inactivated or Recombinant): What You Need to Know 1. Why get vaccinated? Influenza vaccine can prevent influenza (flu). Flu is a contagious disease that spreads around the [place] every year, usually between October and May. Anyone can get the flu, but it is more dangerous for some people. Infants and young children, people 32 years of age and older, pregnant women, and people with certain health conditions or a weakened immune system are at greatest risk of flu complications. Pneumonia, bronchitis, sinus infections and ear infections are examples of flu-related complications. If you have a medical condition, such as heart disease, cancer or diabetes, flu can make it worse. Flu can cause fever and chills, sore throat, muscle aches, fatigue, cough, headache, and runny or stuffy nose. Some people may have vomiting and diarrhea, though this is more common in children than adults. Each year thousands of people in the [place] die from flu, and  many more are hospitalized. Flu vaccine prevents millions of illnesses and flu-related visits to the doctor each year. 2. Influenza vaccine CDC recommends everyone 20 months of age and older get vaccinated every flu season. Children 6 months through 5 years of age may need 2 doses during a single flu season. Everyone else needs only 1 dose each flu season. It takes about 2 weeks for protection to develop after vaccination. There are many flu viruses, and they are always changing. Each year a new flu vaccine is made to protect against three or four viruses that are likely to cause disease in the upcoming flu season. Even when the vaccine doesn't exactly match these viruses, it may still provide some protection. Influenza vaccine does not cause flu. Influenza vaccine may be given at the same time as other vaccines. 3. Talk with your health care provider Tell your vaccine provider if the person getting the vaccine:  Has had an allergic reaction after a previous dose of influenza vaccine, or has any severe, life-threatening allergies.  Has ever had Guillain-Barr Syndrome (also called GBS). In some cases, your health care provider may decide to postpone influenza vaccination to a future visit. People with minor illnesses, such as a cold, may be vaccinated. People who are moderately or severely ill should usually wait until they recover before getting influenza vaccine. Your health care provider can give you more information. 4. Risks of a vaccine reaction  Soreness, redness, and swelling where shot is given, fever, muscle aches, and headache can happen after influenza vaccine.  There may be a very small  increased risk of Guillain-Barr Syndrome (GBS) after inactivated influenza vaccine (the flu shot). Young children who get the flu shot along with pneumococcal vaccine (PCV13), and/or DTaP vaccine at the same time might be slightly more likely to have a seizure caused by fever. Tell your health care  provider if a child who is getting flu vaccine has ever had a seizure. People sometimes faint after medical procedures, including vaccination. Tell your provider if you feel dizzy or have vision changes or ringing in the ears. As with any medicine, there is a very remote chance of a vaccine causing a severe allergic reaction, other serious injury, or death. 5. What if there is a serious problem? An allergic reaction could occur after the vaccinated person leaves the clinic. If you see signs of a severe allergic reaction (hives, swelling of the face and throat, difficulty breathing, a fast heartbeat, dizziness, or weakness), call 9-1-1 and get the person to the nearest hospital. For other signs that concern you, call your health care provider. Adverse reactions should be reported to the Vaccine Adverse Event Reporting System (VAERS). Your health care provider will usually file this report, or you can do it yourself. Visit the VAERS website at www.vaers.LAgents.nohhs.gov or call (678)544-60721-518-067-7395.VAERS is only for reporting reactions, and VAERS staff do not give medical advice. 6. The National Vaccine Injury Compensation Program The [employer] Vaccine Injury Compensation Program (VICP) is a federal program that was created to compensate people who may have been injured by certain vaccines. Visit the VICP website at SpiritualWord.atwww.hrsa.gov/vaccinecompensation or call (207)156-04321-(820)254-5906 to learn about the program and about filing a claim. There is a time limit to file a claim for compensation. 7. How can I learn more?  Ask your healthcare provider.  Call your local or state health department.  Contact the Centers for Disease Control and Prevention (CDC): ? Call 44516309651-(308) 764-0085 (1-800-CDC-INFO) or ? Visit CDC's BiotechRoom.com.cywww.cdc.gov/flu Vaccine Information Statement (Interim) Inactivated Influenza Vaccine (01/23/2018) This information is not intended to replace advice given to you by your health care provider. Make sure you discuss any  questions you have with your health care provider. Document Released: 03/22/2006 Document Revised: 01/27/2018 Document Reviewed: 01/27/2018 Elsevier Interactive Patient Education  2019 Elsevier Inc.  Pneumococcal Polysaccharide Vaccine: What You Need to Know 1. Why get vaccinated? Vaccination can protect older adults (and some children and younger adults) from pneumococcal disease. Pneumococcal disease is caused by bacteria that can spread from person to person through close contact. It can cause ear infections, and it can also lead to more serious infections of the:  Lungs (pneumonia),  Blood (bacteremia), and  Covering of the brain and spinal cord (meningitis). Meningitis can cause deafness and brain damage, and it can be fatal. Anyone can get pneumococcal disease, but children under 172 years of age, people with certain medical conditions, adults over 61 years of age, and cigarette smokers are at the highest risk. About 18,000 older adults die each year from pneumococcal disease in the [place]. Treatment of pneumococcal infections with penicillin and other drugs used to be more effective. But some strains of the disease have become resistant to these drugs. This makes prevention of the disease, through vaccination, even more important. 2. Pneumococcal polysaccharide vaccine (PPSV23) Pneumococcal polysaccharide vaccine (PPSV23) protects against 23 types of pneumococcal bacteria. It will not prevent all pneumococcal disease. PPSV23 is recommended for:  All adults 61 years of age and older,  Anyone 2 through 61 years of age with certain long-term health problems,  Anyone  2 through 61 years of age with a weakened immune system,  Adults 76 through 61 years of age who smoke cigarettes or have asthma. Most people need only one dose of PPSV. A second dose is recommended for certain high-risk groups. People 40 and older should get a dose even if they have gotten one or more doses of the  vaccine before they turned 65. Your healthcare provider can give you more information about these recommendations. Most healthy adults develop protection within 2 to 3 weeks of getting the shot. 3. Some people should not get this vaccine  Anyone who has had a life-threatening allergic reaction to PPSV should not get another dose.  Anyone who has a severe allergy to any component of PPSV should not receive it. Tell your provider if you have any severe allergies.  Anyone who is moderately or severely ill when the shot is scheduled may be asked to wait until they recover before getting the vaccine. Someone with a mild illness can usually be vaccinated.  Children less than 79 years of age should not receive this vaccine.  There is no evidence that PPSV is harmful to either a pregnant woman or to her fetus. However, as a precaution, women who need the vaccine should be vaccinated before becoming pregnant, if possible. 4. Risks of a vaccine reaction With any medicine, including vaccines, there is a chance of side effects. These are usually mild and go away on their own, but serious reactions are also possible. About half of people who get PPSV have mild side effects, such as redness or pain where the shot is given, which go away within about two days. Less than 1 out of 100 people develop a fever, muscle aches, or more severe local reactions. Problems that could happen after any vaccine:  People sometimes faint after a medical procedure, including vaccination. Sitting or lying down for about 15 minutes can help prevent fainting, and injuries caused by a fall. Tell your doctor if you feel dizzy, or have vision changes or ringing in the ears.  Some people get severe pain in the shoulder and have difficulty moving the arm where a shot was given. This happens very rarely.  Any medication can cause a severe allergic reaction. Such reactions from a vaccine are very rare, estimated at about 1 in a million  doses, and would happen within a few minutes to a few hours after the vaccination. As with any medicine, there is a very remote chance of a vaccine causing a serious injury or death. The safety of vaccines is always being monitored. For more information, visit: http://floyd.org/ 5. What if there is a serious reaction? What should I look for? Look for anything that concerns you, such as signs of a severe allergic reaction, very high fever, or unusual behavior. Signs of a severe allergic reaction can include hives, swelling of the face and throat, difficulty breathing, a fast heartbeat, dizziness, and weakness. These would usually start a few minutes to a few hours after the vaccination. What should I do? If you think it is a severe allergic reaction or other emergency that can't wait, call 9-1-1 or get to the nearest hospital. Otherwise, call your doctor. Afterward, the reaction should be reported to the Vaccine Adverse Event Reporting System (VAERS). Your doctor might file this report, or you can do it yourself through the VAERS web site at www.vaers.LAgents.no, or by calling 1-7655117074. VAERS does not give medical advice. 6. How can I learn more?  Ask your doctor. He or she can give you the vaccine package insert or suggest other sources of information.  Call your local or state health department.  Contact the Centers for Disease Control and Prevention (CDC): ? Call 501-642-91061-802-624-8991 (1-800-CDC-INFO) or ? Visit CDC's website at PicCapture.uywww.cdc.gov/vaccines CDC Vaccine Information Statement PPSV Vaccine (10/02/2013) This information is not intended to replace advice given to you by your health care provider. Make sure you discuss any questions you have with your health care provider. Document Released: 03/25/2006 Document Revised: 01/07/2018 Document Reviewed: 01/07/2018 Elsevier Interactive Patient Education  2019 [employer]

## 2018-06-09 NOTE — Telephone Encounter (Signed)
Patient agreed to take flomax for passage of stone and will take ibuprofen only prn for moderate to severe pain.

## 2018-06-10 NOTE — Telephone Encounter (Signed)
Patient aware Lauren notified him yesterday and he doesn't have further question.

## 2018-07-04 ENCOUNTER — Telehealth: Payer: Self-pay

## 2018-07-04 DIAGNOSIS — I491 Atrial premature depolarization: Secondary | ICD-10-CM

## 2018-07-04 DIAGNOSIS — E1165 Type 2 diabetes mellitus with hyperglycemia: Secondary | ICD-10-CM

## 2018-07-04 MED ORDER — BLOOD GLUCOSE METER KIT
PACK | 0 refills | Status: AC
Start: 2018-07-04 — End: ?

## 2018-07-04 NOTE — Telephone Encounter (Signed)
Patient may repeat this early if needed, but I do not recommend repeat until at least 6 weeks after last check.  Realistically, he may not have A1c < 10% until 6 weeks to 3 months from now at best.   - He can know what he might expect by starting to check CBG every morning before eating.  If CBG remains > 200 in mornings it is likely A1c will not be less than 10%.  Call placed back to patient to discuss.  He is checking glucose currently --> 120-160 in mornings since being back on medications, so very likely patient may have A1c < 10% at 6 weeks.

## 2018-07-04 NOTE — Telephone Encounter (Signed)
The pt came by requesting to get a A1C done today. He is willing to pay out of pocket, because he is aware that insurance will not pay for him to repeat this early. 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

## 2018-07-10 ENCOUNTER — Other Ambulatory Visit: Payer: Self-pay | Admitting: Nurse Practitioner

## 2018-07-10 DIAGNOSIS — E119 Type 2 diabetes mellitus without complications: Secondary | ICD-10-CM

## 2018-07-16 ENCOUNTER — Ambulatory Visit (INDEPENDENT_AMBULATORY_CARE_PROVIDER_SITE_OTHER): Payer: BLUE CROSS/BLUE SHIELD

## 2018-07-16 DIAGNOSIS — E1165 Type 2 diabetes mellitus with hyperglycemia: Secondary | ICD-10-CM

## 2018-07-16 LAB — POCT GLYCOSYLATED HEMOGLOBIN (HGB A1C): Hemoglobin A1C: 7.8 % — AB (ref 4.0–5.6)

## 2018-07-16 NOTE — Progress Notes (Signed)
The pt came in the office for a repeat A1C check. His last A1C 11.9% and it was checked on 06/09/18. The pt is   willing to pay out of pocket, because he is aware that insurance will not pay for him to repeat this early. 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

## 2018-07-17 ENCOUNTER — Telehealth: Payer: Self-pay

## 2018-07-17 NOTE — Telephone Encounter (Signed)
The pt was notified. No questions or concerns. 

## 2018-07-17 NOTE — Telephone Encounter (Signed)
-----   Message from Galen Manila, NP sent at 07/16/2018  4:43 PM EST ----- A1c now at goal.  Return to clinic for follow-up visit in about 6 weeks.  May have additional medication adjustment at that time.

## 2018-09-02 ENCOUNTER — Other Ambulatory Visit: Payer: Self-pay | Admitting: Physician Assistant

## 2018-09-02 NOTE — Telephone Encounter (Signed)
Refill Request.  

## 2018-09-03 NOTE — Telephone Encounter (Signed)
This is a [place] pt 

## 2018-09-03 NOTE — Telephone Encounter (Signed)
Refill Request.  

## 2018-09-03 NOTE — Telephone Encounter (Signed)
This is a [place] pt 

## 2018-09-11 ENCOUNTER — Other Ambulatory Visit: Payer: Self-pay | Admitting: Nurse Practitioner

## 2018-09-11 DIAGNOSIS — E1165 Type 2 diabetes mellitus with hyperglycemia: Secondary | ICD-10-CM

## 2018-09-12 ENCOUNTER — Other Ambulatory Visit: Payer: BLUE CROSS/BLUE SHIELD

## 2018-09-12 ENCOUNTER — Other Ambulatory Visit: Payer: Self-pay

## 2018-09-12 DIAGNOSIS — E1165 Type 2 diabetes mellitus with hyperglycemia: Secondary | ICD-10-CM

## 2018-09-13 LAB — LIPID PANEL
Cholesterol: 136 mg/dL (ref <200)
HDL: 48 mg/dL (ref >=40)
LDL Cholesterol (Calc): 68 mg/dL (calc)
Non-HDL Cholesterol (Calc): 88 mg/dL (calc) (ref <130)
Total CHOL/HDL Ratio: 2.8 (calc) (ref <5.0)
Triglycerides: 113 mg/dL (ref <150)

## 2018-09-13 LAB — COMPLETE METABOLIC PANEL WITH GFR
AG Ratio: 1.7 (calc) (ref 1.0–2.5)
ALT: 20 U/L (ref 9–46)
AST: 16 U/L (ref 10–35)
Albumin: 4.6 g/dL (ref 3.6–5.1)
Alkaline phosphatase (APISO): 47 U/L (ref 35–144)
BUN/Creatinine Ratio: 21 (calc) (ref 6–22)
BUN: 27 mg/dL — ABNORMAL HIGH (ref 7–25)
CO2: 23 mmol/L (ref 20–32)
Calcium: 9.6 mg/dL (ref 8.6–10.3)
Chloride: 107 mmol/L (ref 98–110)
Creat: 1.27 mg/dL — ABNORMAL HIGH (ref 0.70–1.25)
GFR, Est African American: 70 mL/min/{1.73_m2} (ref >=60)
GFR, Est Non African American: 61 mL/min/{1.73_m2} (ref >=60)
Globulin: 2.7 g/dL (calc) (ref 1.9–3.7)
Glucose, Bld: 153 mg/dL — ABNORMAL HIGH (ref 65–99)
Potassium: 4.3 mmol/L (ref 3.5–5.3)
Sodium: 140 mmol/L (ref 135–146)
Total Bilirubin: 0.4 mg/dL (ref 0.2–1.2)
Total Protein: 7.3 g/dL (ref 6.1–8.1)

## 2018-09-13 LAB — HEMOGLOBIN A1C
Hgb A1c MFr Bld: 7.2 % of total Hgb — ABNORMAL HIGH (ref <5.7)
Mean Plasma Glucose: 160 (calc)
eAG (mmol/L): 8.9 (calc)

## 2018-09-15 ENCOUNTER — Other Ambulatory Visit: Payer: Self-pay

## 2018-09-15 ENCOUNTER — Encounter: Payer: Self-pay | Admitting: Nurse Practitioner

## 2018-09-15 ENCOUNTER — Ambulatory Visit (INDEPENDENT_AMBULATORY_CARE_PROVIDER_SITE_OTHER): Payer: BLUE CROSS/BLUE SHIELD | Admitting: Nurse Practitioner

## 2018-09-15 DIAGNOSIS — E1165 Type 2 diabetes mellitus with hyperglycemia: Secondary | ICD-10-CM | POA: Diagnosis not present

## 2018-09-15 NOTE — Progress Notes (Signed)
Telemedicine Encounter: Disclosed to patient at start of encounter that we will provide appropriate telemedicine services.  Patient consents to be treated via phone prior to discussion. - Patient is at his home and is accessed via telephone. - Services are provided by [name] from [facility].   Subjective:    Patient ID: Jeremy Kelly., male    DOB: 06/27/1955, 63 y.o.   MRN: [id number] Jeremy Kelly. is a 63 y.o. male presenting on 09/15/2018 for Diabetes  HPI Diabetes Pt presents today for follow up of Type 2 diabetes mellitus. He is checking fasting am CBG at home with a range of Midnight checks between 120-170 at end of day.  Checks occasionally fasting and is "about the same."  None over 200.  [place] fries and baked potatoes are culprit for higher blood sugars.  Patient asks if a fiber one chewy bar would be okay to eat (reads off carb/protein nutrition info for 29g carb, 8g added sugar, 9g dietary fiber, 2g protein). - Current diabetic medications include: metformin, invokana, sitagliptin 100 mg daily. - He is not currently symptomatic.  - He denies polydipsia, polyphagia, polyuria, headaches, diaphoresis, shakiness, chills, pain, numbness or tingling in extremities and changes in vision.   - Clinical course has been stable since last A1c check, significantly improved over last 3.5 months. - He  reports no regular exercise routine. - His diet is moderate in salt, moderate in fat, and low in carbohydrates. - Weight trend: stable  PREVENTION: Eye exam current (within one year): no Foot exam current (within one year): no - Virtual visit prevents ability to check feet today.  Patient reports no lesions/blisters Lipid/ASCVD risk reduction - on statin: no - Patient declines LDL<70 Kidney protection - on ace or arb: no  Recent Labs    06/09/18 0914 07/16/18 1530 09/12/18 0855  HGBA1C 11.9* 7.8* 7.2*    Social History   Tobacco Use  .  Smoking status: Current Every Day Smoker    Types: Cigars  . Smokeless tobacco: Never Used  . Tobacco comment: 3-4 cigars a day  Substance Use Topics  . Alcohol use: No  . Drug use: No    Review of Systems Per HPI unless specifically indicated above     Objective:    There were no vitals taken for this visit.  Wt Readings from Last 3 Encounters:  06/09/18 218 lb 3.2 oz (99 kg)  06/17/17 218 lb (98.9 kg)  06/05/17 220 lb 6.4 oz (100 kg)    Physical Exam Patient remotely monitored.  Verbal communication appropriate.  Cognition normal.  Results for orders placed or performed in visit on 09/12/18  Lipid panel  Result Value Ref Range   Cholesterol 136 <200 mg/dL   HDL 48 > OR = 40 mg/dL   Triglycerides 277 <824 mg/dL   LDL Cholesterol (Calc) 68 mg/dL (calc)   Total CHOL/HDL Ratio 2.8 <5.0 (calc)   Non-HDL Cholesterol (Calc) 88 <235 mg/dL (calc)  COMPLETE METABOLIC PANEL WITH GFR  Result Value Ref Range   Glucose, Bld 153 (H) 65 - 99 mg/dL   BUN 27 (H) 7 - 25 mg/dL   Creat 3.61 (H) 4.43 - 1.25 mg/dL   GFR, Est Non African American 61 > OR = 60 mL/min/1.79m2   GFR, Est African American 70 > OR = 60 mL/min/1.15m2   BUN/Creatinine Ratio 21 6 - 22 (calc)   Sodium 140 135 - 146 mmol/L   Potassium 4.3 3.5 -  5.3 mmol/L   Chloride 107 98 - 110 mmol/L   CO2 23 20 - 32 mmol/L   Calcium 9.6 8.6 - 10.3 mg/dL   Total Protein 7.3 6.1 - 8.1 g/dL   Albumin 4.6 3.6 - 5.1 g/dL   Globulin 2.7 1.9 - 3.7 g/dL (calc)   AG Ratio 1.7 1.0 - 2.5 (calc)   Total Bilirubin 0.4 0.2 - 1.2 mg/dL   Alkaline phosphatase (APISO) 47 35 - 144 U/L   AST 16 10 - 35 U/L   ALT 20 9 - 46 U/L  Hemoglobin A1c  Result Value Ref Range   Hgb A1c MFr Bld 7.2 (H) <5.7 % of total Hgb   Mean Plasma Glucose 160 (calc)   eAG (mmol/L) 8.9 (calc)      Assessment & Plan:   Problem List Items Addressed This Visit      Endocrine   Uncontrolled type 2 diabetes mellitus with hyperglycemia, without long-term current  use of insulin (HCC) - Primary   Relevant Orders   COMPLETE METABOLIC PANEL WITH GFR   Hemoglobin A1c   Lipid panel    Remains uncontrolled T2DM with A1c improved to 7.2% but remaining above goal A1c < 7.0%. - Complications - hyperglycemia.  Plan:  1. Continue current therapy: metformin, Januvia, and Invokana 2. Encourage improved lifestyle: - low carb/low glycemic diet reinforced prior education - Increase physical activity to 30 minutes most days of the week.  Explained that increased physical activity increases body's use of sugar for energy. 3. Check fasting am CBG and bring log to next visit for review 4. Continue ASA.  Discussed adding statin, ace/arb but patient declines today. 5. Advised to schedule DM ophtho exam, send record. 6. Labs reviewed with patient today on phone. 7. Follow-up 3 months.  - Time spent in direct consultation with patient via telemedicine about above concerns: 14 minutes  Follow up plan: Follow-up 3 months with labs.  [name], DNP, AGPCNP-BC Adult Gerontology Primary Care Nurse Practitioner [facility] 09/15/2018, 9:26 AM

## 2018-09-16 ENCOUNTER — Encounter: Payer: Self-pay | Admitting: Nurse Practitioner

## 2018-09-16 MED ORDER — SITAGLIPTIN PHOSPHATE 100 MG PO TABS: 100.0000 mg | ORAL_TABLET | Freq: Every day | ORAL | 1 refills | Status: DC

## 2018-09-16 MED ORDER — CANAGLIFLOZIN 300 MG PO TABS: ORAL_TABLET | ORAL | 1 refills | Status: DC

## 2018-09-16 MED ORDER — METFORMIN HCL 1000 MG PO TABS: 1000.0000 mg | ORAL_TABLET | Freq: Two times a day (BID) | ORAL | 1 refills | Status: DC

## 2018-09-25 ENCOUNTER — Telehealth: Payer: Self-pay

## 2018-09-25 NOTE — Telephone Encounter (Signed)
Virtual Visit Pre-Appointment Phone Call  Steps For Call:  1. Confirm consent - "In the setting of the current Covid19 crisis, you are scheduled for a phone visit with your provider on Oct 20, 2018 at 10:20.  Just as we do with many in-office visits, in order for you to participate in this visit, we must obtain consent.  If you'd like, I can send this to your mychart (if signed up) or email for you to review.  Otherwise, I can obtain your verbal consent now.  All virtual visits are billed to your insurance company just like a normal visit would be.  By agreeing to a virtual visit, we'd like you to understand that the technology does not allow for your provider to perform an examination, and thus may limit your provider's ability to fully assess your condition.  Finally, though the technology is pretty good, we cannot assure that it will always work on either your or our end, and in the setting of a video visit, we may have to convert it to a phone-only visit.  In either situation, we cannot ensure that we have a secure connection.  Are you willing to proceed?" STAFF: Did the patient verbally acknowledge consent to telehealth visit? Document YES/NO here: YES  2. Confirm the BEST phone number to call the day of the visit by including in appointment notes  3. Give patient instructions for WebEx/MyChart download to smartphone as below or Doximity/Doxy.me if video visit (depending on what platform provider is using)  4. Advise patient to be prepared with their blood pressure, heart rate, weight, any heart rhythm information, their current medicines, and a piece of paper and pen handy for any instructions they may receive the day of their visit  5. Inform patient they will receive a phone call 15 minutes prior to their appointment time (may be from unknown caller ID) so they should be prepared to answer  6. Confirm that appointment type is correct in Epic appointment notes (VIDEO vs PHONE)      TELEPHONE CALL NOTE  Jeremy Kelly. has been deemed a candidate for a follow-up tele-health visit to limit community exposure during the Covid-19 pandemic. I spoke with the patient via phone to ensure availability of phone/video source, confirm preferred email & phone number, and discuss instructions and expectations.  I reminded Jeremy Kelly. to be prepared with any vital sign and/or heart rhythm information that could potentially be obtained via home monitoring, at the time of his visit. I reminded Jeremy Kelly. to expect a phone call at the time of his visit if his visit.  Jeremy Kelly 09/25/2018 1:24 PM  IF USING DOXIMITY or DOXY.ME - The patient will receive a link just prior to their visit, either by text or email (to be determined day of appointment depending on if it's doxy.me or Doximity).     FULL LENGTH CONSENT FOR TELE-HEALTH VISIT   I hereby voluntarily request, consent and authorize [facility] and its employed or contracted physicians, physician assistants, nurse practitioners or other licensed health care professionals (the Practitioner), to provide me with telemedicine health care services (the "Services") as deemed necessary by the treating Practitioner. I acknowledge and consent to receive the Services by the Practitioner via telemedicine. I understand that the telemedicine visit will involve communicating with the Practitioner through live audiovisual communication technology and the disclosure of certain medical information by electronic transmission. I acknowledge that I have been given  the opportunity to request an in-person assessment or other available alternative prior to the telemedicine visit and am voluntarily participating in the telemedicine visit.  I understand that I have the right to withhold or withdraw my consent to the use of telemedicine in the course of my care at any time, without affecting my right to future care or  treatment, and that the Practitioner or I may terminate the telemedicine visit at any time. I understand that I have the right to inspect all information obtained and/or recorded in the course of the telemedicine visit and may receive copies of available information for a reasonable fee.  I understand that some of the potential risks of receiving the Services via telemedicine include:  Marland Kitchen Delay or interruption in medical evaluation due to technological equipment failure or disruption; . Information transmitted may not be sufficient (e.g. poor resolution of images) to allow for appropriate medical decision making by the Practitioner; and/or  . In rare instances, security protocols could fail, causing a breach of personal health information.  Furthermore, I acknowledge that it is my responsibility to provide information about my medical history, conditions and care that is complete and accurate to the best of my ability. I acknowledge that Practitioner's advice, recommendations, and/or decision may be based on factors not within their control, such as incomplete or inaccurate data provided by me or distortions of diagnostic images or specimens that may result from electronic transmissions. I understand that the practice of medicine is not an exact science and that Practitioner makes no warranties or guarantees regarding treatment outcomes. I acknowledge that I will receive a copy of this consent concurrently upon execution via email to the email address I last provided but may also request a printed copy by calling the office of [place].    I understand that my insurance will be billed for this visit.   I have read or had this consent read to me. . I understand the contents of this consent, which adequately explains the benefits and risks of the Services being provided via telemedicine.  . I have been provided ample opportunity to ask questions regarding this consent and the Services and have had my  questions answered to my satisfaction. . I give my informed consent for the services to be provided through the use of telemedicine in my medical care  By participating in this telemedicine visit I agree to the above.

## 2018-10-15 ENCOUNTER — Other Ambulatory Visit: Payer: Self-pay

## 2018-10-15 ENCOUNTER — Telehealth: Payer: Self-pay

## 2018-10-15 ENCOUNTER — Observation Stay
Admission: EM | Admit: 2018-10-15 | Discharge: 2018-10-16 | Disposition: A | Discharge status: Home / Self Care | Payer: BLUE CROSS/BLUE SHIELD | Attending: Specialist | Admitting: Specialist

## 2018-10-15 ENCOUNTER — Telehealth: Payer: Self-pay | Admitting: Cardiovascular Disease

## 2018-10-15 ENCOUNTER — Encounter: Payer: Self-pay | Admitting: Emergency Medicine

## 2018-10-15 ENCOUNTER — Observation Stay: Payer: BLUE CROSS/BLUE SHIELD

## 2018-10-15 ENCOUNTER — Emergency Department: Payer: BLUE CROSS/BLUE SHIELD

## 2018-10-15 DIAGNOSIS — I639 Cerebral infarction, unspecified: Secondary | ICD-10-CM | POA: Diagnosis present

## 2018-10-15 DIAGNOSIS — Z1159 Encounter for screening for other viral diseases: Secondary | ICD-10-CM | POA: Insufficient documentation

## 2018-10-15 DIAGNOSIS — Z7984 Long term (current) use of oral hypoglycemic drugs: Secondary | ICD-10-CM | POA: Diagnosis not present

## 2018-10-15 DIAGNOSIS — Z7901 Long term (current) use of anticoagulants: Secondary | ICD-10-CM | POA: Insufficient documentation

## 2018-10-15 DIAGNOSIS — E1165 Type 2 diabetes mellitus with hyperglycemia: Secondary | ICD-10-CM

## 2018-10-15 DIAGNOSIS — R29898 Other symptoms and signs involving the musculoskeletal system: Secondary | ICD-10-CM | POA: Insufficient documentation

## 2018-10-15 DIAGNOSIS — I480 Paroxysmal atrial fibrillation: Secondary | ICD-10-CM | POA: Insufficient documentation

## 2018-10-15 DIAGNOSIS — F17290 Nicotine dependence, other tobacco product, uncomplicated: Secondary | ICD-10-CM | POA: Insufficient documentation

## 2018-10-15 DIAGNOSIS — E119 Type 2 diabetes mellitus without complications: Secondary | ICD-10-CM | POA: Insufficient documentation

## 2018-10-15 DIAGNOSIS — Z880 Allergy status to penicillin: Secondary | ICD-10-CM | POA: Insufficient documentation

## 2018-10-15 LAB — CBC
HCT: 51.3 % (ref 39.0–52.0)
Hemoglobin: 17.1 g/dL — ABNORMAL HIGH (ref 13.0–17.0)
MCH: 30.6 pg (ref 26.0–34.0)
MCHC: 33.3 g/dL (ref 30.0–36.0)
MCV: 91.9 fL (ref 80.0–100.0)
Platelets: 197 10*3/uL (ref 150–400)
RBC: 5.58 MIL/uL (ref 4.22–5.81)
RDW: 12.4 % (ref 11.5–15.5)
WBC: 6.8 10*3/uL (ref 4.0–10.5)
nRBC: 0 % (ref 0.0–0.2)

## 2018-10-15 LAB — DIFFERENTIAL
Abs Immature Granulocytes: 0.02 10*3/uL (ref 0.00–0.07)
Basophils Absolute: 0.1 10*3/uL (ref 0.0–0.1)
Basophils Relative: 1 %
Eosinophils Absolute: 0.2 10*3/uL (ref 0.0–0.5)
Eosinophils Relative: 4 %
Immature Granulocytes: 0 %
Lymphocytes Relative: 17 %
Lymphs Abs: 1.1 10*3/uL (ref 0.7–4.0)
Monocytes Absolute: 0.5 10*3/uL (ref 0.1–1.0)
Monocytes Relative: 8 %
Neutro Abs: 4.8 10*3/uL (ref 1.7–7.7)
Neutrophils Relative %: 70 %

## 2018-10-15 LAB — COMPREHENSIVE METABOLIC PANEL
ALT: 27 U/L (ref 0–44)
AST: 21 U/L (ref 15–41)
Albumin: 4.6 g/dL (ref 3.5–5.0)
Alkaline Phosphatase: 49 U/L (ref 38–126)
Anion gap: 10 (ref 5–15)
BUN: 28 mg/dL — ABNORMAL HIGH (ref 8–23)
CO2: 21 mmol/L — ABNORMAL LOW (ref 22–32)
Calcium: 9.5 mg/dL (ref 8.9–10.3)
Chloride: 107 mmol/L (ref 98–111)
Creatinine, Ser: 1.2 mg/dL (ref 0.61–1.24)
GFR calc Af Amer: >60 mL/min (ref >60)
GFR calc non Af Amer: >60 mL/min (ref >60)
Glucose, Bld: 189 mg/dL — ABNORMAL HIGH (ref 70–99)
Potassium: 4.4 mmol/L (ref 3.5–5.1)
Sodium: 138 mmol/L (ref 135–145)
Total Bilirubin: 0.7 mg/dL (ref 0.3–1.2)
Total Protein: 8.3 g/dL — ABNORMAL HIGH (ref 6.5–8.1)

## 2018-10-15 LAB — PROTIME-INR
INR: 1.1 (ref 0.8–1.2)
Prothrombin Time: 14.5 seconds (ref 11.4–15.2)

## 2018-10-15 LAB — SARS CORONAVIRUS 2 BY RT PCR (HOSPITAL ORDER, PERFORMED IN ~~LOC~~ HOSPITAL LAB): SARS Coronavirus 2: NEGATIVE

## 2018-10-15 LAB — GLUCOSE, CAPILLARY
Glucose-Capillary: 127 mg/dL — ABNORMAL HIGH (ref 70–99)
Glucose-Capillary: 239 mg/dL — ABNORMAL HIGH (ref 70–99)

## 2018-10-15 LAB — APTT: aPTT: 34 seconds (ref 24–36)

## 2018-10-15 MED ORDER — ACETAMINOPHEN 325 MG PO TABS: 650.0000 mg | ORAL_TABLET | ORAL | Status: DC | PRN

## 2018-10-15 MED ORDER — INSULIN ASPART 100 UNIT/ML ~~LOC~~ SOLN
0.0000 [IU] | Freq: Three times a day (TID) | SUBCUTANEOUS | Status: DC
  Administered 2018-10-15: 18:00:00 1 [IU] via SUBCUTANEOUS
  Administered 2018-10-16: 08:00:00 2 [IU] via SUBCUTANEOUS
  Filled 2018-10-15 (×2): qty 1

## 2018-10-15 MED ORDER — ACETAMINOPHEN 650 MG RE SUPP: 650.0000 mg | RECTAL | Status: DC | PRN

## 2018-10-15 MED ORDER — ACETAMINOPHEN 160 MG/5ML PO SOLN
650.0000 mg | ORAL | Status: DC | PRN
  Filled 2018-10-15: qty 20.3

## 2018-10-15 MED ORDER — INSULIN ASPART 100 UNIT/ML ~~LOC~~ SOLN
0.0000 [IU] | Freq: Every day | SUBCUTANEOUS | Status: DC
  Administered 2018-10-15: 2 [IU] via SUBCUTANEOUS
  Filled 2018-10-15: qty 1

## 2018-10-15 MED ORDER — RIVAROXABAN 20 MG PO TABS
20.0000 mg | ORAL_TABLET | Freq: Every day | ORAL | Status: DC
  Administered 2018-10-15: 18:00:00 20 mg via ORAL
  Filled 2018-10-15 (×2): qty 1

## 2018-10-15 MED ORDER — STROKE: EARLY STAGES OF RECOVERY BOOK
Freq: Once | Status: AC
  Administered 2018-10-15: 17:00:00

## 2018-10-15 NOTE — ED Notes (Signed)
ED TO INPATIENT HANDOFF REPORT  ED Nurse Name and Phone #: [phone]   S Name/Age/Gender Jeremy KochBobby Darrell Huard Jr. 63 y.o. male Room/Bed: ED16A/ED16A  Code Status   Code Status: Not on file  Home/SNF/Other Home Patient oriented to: self, place, time and situation Is this baseline? Yes   Triage Complete: Triage complete  Chief Complaint r side numbness  Triage Note Pt presents to ED via POV with c/o R sided numbness since Sunday. Pt denies weakness, facial droop at this time. Pt is alert and oriented at this time.    Allergies Allergies  Allergen Reactions  . Penicillins     Level of Care/Admitting Diagnosis ED Disposition    ED Disposition Condition Comment   Admit  [facility] [[id number]]  Level of Care: Med-Surg [16]  Covid Evaluation: Screening Protocol (No Symptoms)  Diagnosis: CVA (cerebral vascular accident) [facility](HCC) [[id number]][[id number]]  Admitting Physician: [place] SirenSAINANI, VIVEK J [[id number]]  Attending Physician: [place] SirenSAINANI, VIVEK J [[id number]]  PT Class (Do Not Modify): Observation [104]  PT Acc Code (Do Not Modify): Observation [10022]       B Medical/Surgery History Past Medical History:  Diagnosis Date  . Diabetes mellitus with complication (HCC)   . PAF (paroxysmal atrial fibrillation) (HCC)    a. noted on EKG 11/2012; b. CHADS2VASc => 1 (DM)   Past Surgical History:  Procedure Laterality Date  . TONSILLECTOMY       A IV Location/Drains/Wounds Patient Lines/Drains/Airways Status   Active Line/Drains/Airways    Name:   Placement date:   Placement time:   Site:   Days:   Peripheral IV 10/15/18 Left Forearm   10/15/18    1338    Forearm   less than 1          Intake/Output Last 24 hours No intake or output data in the 24 hours ending 10/15/18 1542  Labs/Imaging Results for orders placed or performed during the hospital encounter of 10/15/18 (from the past 48 hour(s))  Protime-INR     Status: None   Collection Time: 10/15/18 12:05 PM   Result Value Ref Range   Prothrombin Time 14.5 11.4 - 15.2 seconds   INR 1.1 0.8 - 1.2    Comment: (NOTE) INR goal varies based on device and disease states. Performed at [facility], [address] [place], [place] [address]   APTT     Status: None   Collection Time: 10/15/18 12:05 PM  Result Value Ref Range   aPTT 34 24 - 36 seconds    Comment: Performed at [facility], [address] [place], [place] [address]  CBC     Status: Abnormal   Collection Time: 10/15/18 12:05 PM  Result Value Ref Range   WBC 6.8 4.0 - 10.5 K/uL   RBC 5.58 4.22 - 5.81 MIL/uL   Hemoglobin 17.1 (H) 13.0 - 17.0 g/dL   HCT 84.151.3 32.439.0 - 40.152.0 %   MCV 91.9 80.0 - 100.0 fL   MCH 30.6 26.0 - 34.0 pg   MCHC 33.3 30.0 - 36.0 g/dL   RDW 02.712.4 25.311.5 - 66.415.5 %   Platelets 197 150 - 400 K/uL   nRBC 0.0 0.0 - 0.2 %    Comment: Performed at [facility], [address] [place], [place] [address]  Differential     Status: None   Collection Time: 10/15/18 12:05 PM  Result Value Ref Range   Neutrophils Relative % 70 %   Neutro Abs 4.8 1.7 -  7.7 K/uL   Lymphocytes Relative 17 %   Lymphs Abs 1.1 0.7 - 4.0 K/uL   Monocytes Relative 8 %   Monocytes Absolute 0.5 0.1 - 1.0 K/uL   Eosinophils Relative 4 %   Eosinophils Absolute 0.2 0.0 - 0.5 K/uL   Basophils Relative 1 %   Basophils Absolute 0.1 0.0 - 0.1 K/uL   Immature Granulocytes 0 %   Abs Immature Granulocytes 0.02 0.00 - 0.07 K/uL    Comment: Performed at [facility], [address] [place], [place] [address]  Comprehensive metabolic panel     Status: Abnormal   Collection Time: 10/15/18 12:05 PM  Result Value Ref Range   Sodium 138 135 - 145 mmol/L   Potassium 4.4 3.5 - 5.1 mmol/L   Chloride 107 98 - 111 mmol/L   CO2 21 (L) 22 - 32 mmol/L   Glucose, Bld 189 (H) 70 - 99 mg/dL   BUN 28 (H) 8 - 23 mg/dL   Creatinine, Ser 2.54 0.61 - 1.24 mg/dL   Calcium 9.5 8.9 - 27.0 mg/dL   Total Protein 8.3 (H) 6.5 - 8.1 g/dL    Albumin 4.6 3.5 - 5.0 g/dL   AST 21 15 - 41 U/L   ALT 27 0 - 44 U/L   Alkaline Phosphatase 49 38 - 126 U/L   Total Bilirubin 0.7 0.3 - 1.2 mg/dL   GFR calc non Af Amer >60 >60 mL/min   GFR calc Af Amer >60 >60 mL/min   Anion gap 10 5 - 15    Comment: Performed at [facility], [address] [place], [place] [address]  SARS Coronavirus 2 (CEPHEID - Performed in [facility] hospital lab), Hosp Order     Status: None   Collection Time: 10/15/18  1:29 PM  Result Value Ref Range   SARS Coronavirus 2 NEGATIVE NEGATIVE    Comment: (NOTE) If result is NEGATIVE SARS-CoV-2 target nucleic acids are NOT DETECTED. The SARS-CoV-2 RNA is generally detectable in upper and lower  respiratory specimens during the acute phase of infection. The lowest  concentration of SARS-CoV-2 viral copies this assay can detect is 250  copies / mL. A negative result does not preclude SARS-CoV-2 infection  and should not be used as the sole basis for treatment or other  patient management decisions.  A negative result may occur with  improper specimen collection / handling, submission of specimen other  than nasopharyngeal swab, presence of viral mutation(s) within the  areas targeted by this assay, and inadequate number of viral copies  (<250 copies / mL). A negative result must be combined with clinical  observations, patient history, and epidemiological information. If result is POSITIVE SARS-CoV-2 target nucleic acids are DETECTED. The SARS-CoV-2 RNA is generally detectable in upper and lower  respiratory specimens dur ing the acute phase of infection.  Positive  results are indicative of active infection with SARS-CoV-2.  Clinical  correlation with patient history and other diagnostic information is  necessary to determine patient infection status.  Positive results do  not rule out bacterial infection or co-infection with other viruses. If result is PRESUMPTIVE POSTIVE SARS-CoV-2 nucleic acids  MAY BE PRESENT.   A presumptive positive result was obtained on the submitted specimen  and confirmed on repeat testing.  While 2019 novel coronavirus  (SARS-CoV-2) nucleic acids may be present in the submitted sample  additional confirmatory testing may be necessary for epidemiological  and / or clinical management purposes  to differentiate between  SARS-CoV-2 and other Sarbecovirus currently known to infect humans.  If clinically indicated additional testing with an alternate test  methodology [phone]) is advised. The SARS-CoV-2 RNA is generally  detectable in upper and lower respiratory sp ecimens during the acute  phase of infection. The expected result is Negative. Fact Sheet for Patients:  BoilerBrush.com.cy Fact Sheet for Healthcare Providers: https://pope.com/ This test is not yet approved or cleared by the [place] FDA and has been authorized for detection and/or diagnosis of SARS-CoV-2 by FDA under an Emergency Use Authorization (EUA).  This EUA will remain in effect (meaning this test can be used) for the duration of the COVID-19 declaration under Section 564(b)(1) of the Act, 21 U.S.C. section 360bbb-3(b)(1), unless the authorization is terminated or revoked sooner. Performed at [facility], [address] [place], [place] [address]    Ct Head Wo Contrast  Result Date: 10/15/2018 CLINICAL DATA:  Right-sided numbness since Sunday. EXAM: CT HEAD WITHOUT CONTRAST TECHNIQUE: Contiguous axial images were obtained from the base of the skull through the vertex without intravenous contrast. COMPARISON:  None. FINDINGS: Brain: Age-indeterminate lacunar infarct in the lateral left thalamus. No evidence of hemorrhage, hydrocephalus, extra-axial collection or mass lesion/mass effect. Vascular: Atherosclerotic vascular calcification of the carotid siphons. No hyperdense vessel. Skull: Normal. Negative for fracture or focal  lesion. Sinuses/Orbits: No acute finding. Other: None. IMPRESSION: 1. Age-indeterminate lacunar infarct in the lateral left thalamus. Electronically Signed   By: Obie Dredge M.D.   On: 10/15/2018 12:17    Pending Labs [employer] (From admission, onward)    Start     Ordered   Signed and Held  HIV antibody (Routine Testing)  Once,   R     Signed and Held   Signed and Held  Hemoglobin A1c  Tomorrow morning,   R     Signed and Held   Signed and Held  Lipid panel  Tomorrow morning,   R    Comments:  Fasting    Signed and Held          Vitals/Pain Today's Vitals   10/15/18 1145 10/15/18 1154  BP:  137/80  Pulse:  60  Resp:  16  Temp:  98.2 F (36.8 C)  TempSrc:  Oral  SpO2:  98%  Weight: 95.3 kg   Height:  (1.727 m)   PainSc: 0-No pain     Isolation Precautions No active isolations  Medications Medications - No data to display  Mobility walks Low fall risk   Focused Assessments 1   R Recommendations: See Admitting Provider Note  Report given to:   Additional Notes:

## 2018-10-15 NOTE — Telephone Encounter (Signed)
[name] with volunteer at [facility] states Pt walked into [facility] [employer] and states he would like to talk with Dr. Mariah Milling, states he thought he had a stroke. States his right arm. Leg, hand, and side of face is numb. Denies any slurred speech.

## 2018-10-15 NOTE — Telephone Encounter (Signed)
Acknowledged. Reviewed phone call earlier, he was advised to go to Hospital Emergency Dept due to concern of possible Stroke.  Saralyn Pilar, DO [facility] 10/15/2018, 1:10 PM

## 2018-10-15 NOTE — Progress Notes (Signed)
Pt presently off unit in mri. A/o. nih 0 no swallow difficulty. Speech clear. Rt sided face  mout arm and leg hand numbness  Cont.

## 2018-10-15 NOTE — Telephone Encounter (Signed)
Patient called and concerned about getting numbness on Right side started with hand onset 4 days, now progressively getting worst in right face and leg. He wanted to know if we can do any testing. He denies chest or pressure, HA or slurred speech, blurred vision or dizziness. Advised patient to get evaluated by Urgent care or ER and Dr. Kirtland Bouchard is aware. As per patient he also has cardiologist that he will reach out.

## 2018-10-15 NOTE — Telephone Encounter (Signed)
Spoke with volunteer briefly who put the patient on the phone. Patient confirmed symptoms as listed in previous entry to encounter and that they started Sunday. States he's never had the symptoms before. Patient stated he has appointment with Dr Mariah Milling next week and thought he could wait until then. Advised that in this situation he should have them take him straight to the emergency department immediately. He verbalized understanding and agreed.

## 2018-10-15 NOTE — ED Triage Notes (Signed)
Pt presents to ED via POV with c/o R sided numbness since Sunday. Pt denies weakness, facial droop at this time. Pt is alert and oriented at this time.

## 2018-10-15 NOTE — H&P (Signed)
[facility] NAME: Jeremy Kelly    MR#:  [id number]  DATE OF BIRTH:  1956-05-03  DATE OF ADMISSION:  10/15/2018  PRIMARY CARE PHYSICIAN: Mikey College, NP   REQUESTING/REFERRING PHYSICIAN: Dr. Lenise Arena  CHIEF COMPLAINT:   Chief Complaint  Patient presents with  . Numbness    HISTORY OF PRESENT ILLNESS:  Jeremy Kelly  is a 63 y.o. male with a known history of diabetes, paroxysmal atrial fibrillation who presents to the hospital due to right sided numbness.  Patient says symptoms began this past Sunday where he developed some right facial numbness and also some right hand and right foot numbness.  In terms got a bit worse today so he was a bit concerned and therefore came to the ER for further evaluation.  He denies any headache, nausea, vomiting, weakness.  He denies any chest pains, shortness of breath, cough fever chills or any recent sick contacts.  He presents to the hospital underwent a CT scan which showed a age indeterminant/remote left-sided lacunar infarct.  Hospitalist services were contacted for admission.  PAST MEDICAL HISTORY:   Past Medical History:  Diagnosis Date  . Diabetes mellitus with complication ([facility])   . PAF (paroxysmal atrial fibrillation) ([place])    a. noted on EKG 11/2012; b. CHADS2VASc => 1 (DM)    PAST SURGICAL HISTORY:   Past Surgical History:  Procedure Laterality Date  . TONSILLECTOMY      SOCIAL HISTORY:   Social History   Tobacco Use  . Smoking status: Current Every Day Smoker    Types: Cigars  . Smokeless tobacco: Never Used  . Tobacco comment: 3-4 cigars a day  Substance Use Topics  . Alcohol use: No    FAMILY HISTORY:   Family History  Problem Relation Age of Onset  . Cancer Mother   . Stroke Mother   . Leukemia Father     DRUG ALLERGIES:   Allergies  Allergen Reactions  . Penicillins     REVIEW OF SYSTEMS:   Review of Systems  Constitutional: Negative  for chills, fever and weight loss.  HENT: Negative for congestion, nosebleeds and tinnitus.   Eyes: Negative for blurred vision, double vision and redness.  Respiratory: Negative for cough, hemoptysis, shortness of breath and wheezing.   Cardiovascular: Negative for chest pain, orthopnea, leg swelling and PND.  Gastrointestinal: Negative for abdominal pain, diarrhea, melena, nausea and vomiting.  Genitourinary: Negative for dysuria, hematuria and urgency.  Musculoskeletal: Negative for falls and joint pain.  Neurological: Positive for sensory change (right sided numbness). Negative for dizziness, tingling, focal weakness, seizures, weakness and headaches.  Endo/Heme/Allergies: Negative for polydipsia. Does not bruise/bleed easily.  Psychiatric/Behavioral: Negative for depression and memory loss. The patient is not nervous/anxious.   All other systems reviewed and are negative.   MEDICATIONS AT HOME:   Prior to Admission medications   Medication Sig Start Date End Date Taking? Authorizing Provider  blood glucose meter kit and supplies Dispense based on patient and insurance preference. Use up to two times daily as directed. (FOR ICD-10 E10.9, E11.9). 07/04/18   Mikey College, NP  canagliflozin [facility]) 300 MG TABS tablet Take 1 tablet (300 mg total) by mouth daily before breakfast. 09/16/18   Mikey College, NP  metFORMIN (GLUCOPHAGE) 1000 MG tablet Take 1 tablet (1,000 mg total) by mouth 2 (two) times daily with a meal. 09/16/18   Mikey College, NP  sitaGLIPtin (JANUVIA) 100  MG tablet Take 1 tablet (100 mg total) by mouth daily. 09/16/18   Mikey College, NP  XARELTO 20 MG TABS tablet Take 1 tablet (20 mg total) by mouth daily with supper. 09/03/18   Minna Merritts, MD      VITAL SIGNS:  Blood pressure 137/80, pulse 60, temperature 98.2 F (36.8 C), temperature source Oral, resp. rate 16, height _0  (1.727 m), weight 95.3 kg, SpO2 98 %.  PHYSICAL  EXAMINATION:  Physical Exam  GENERAL:  63 y.o.-year-old patient lying in the bed with no acute distress.  EYES: Pupils equal, round, reactive to light and accommodation. No scleral icterus. Extraocular muscles intact.  HEENT: Head atraumatic, normocephalic. Oropharynx and nasopharynx clear. No oropharyngeal erythema, moist oral mucosa  NECK:  Supple, no jugular venous distention. No thyroid enlargement, no tenderness.  LUNGS: Normal breath sounds bilaterally, no wheezing, rales, rhonchi. No use of accessory muscles of respiration.  CARDIOVASCULAR: S1, S2 RRR. No murmurs, rubs, gallops, clicks.  ABDOMEN: Soft, nontender, nondistended. Bowel sounds present. No organomegaly or mass.  EXTREMITIES: No pedal edema, cyanosis, or clubbing. + 2 pedal & radial pulses b/l.   NEUROLOGIC: Cranial nerves II through XII are intact. No focal Motor or sensory deficits appreciated b/l. Right sided facial, hand and foot numbness.  PSYCHIATRIC: The patient is alert and oriented x 3. Good affect.  SKIN: No obvious rash, lesion, or ulcer.   LABORATORY PANEL:   CBC Recent Labs  Lab 10/15/18 1205  WBC 6.8  HGB 17.1*  HCT 51.3  PLT 197   ------------------------------------------------------------------------------------------------------------------  Chemistries  Recent Labs  Lab 10/15/18 1205  NA 138  K 4.4  CL 107  CO2 21*  GLUCOSE 189*  BUN 28*  CREATININE 1.20  CALCIUM 9.5  AST 21  ALT 27  ALKPHOS 49  BILITOT 0.7   ------------------------------------------------------------------------------------------------------------------  Cardiac Enzymes No results for input(s): TROPONINI in the last 168 hours. ------------------------------------------------------------------------------------------------------------------  RADIOLOGY:  Ct Head Wo Contrast  Result Date: 10/15/2018 CLINICAL DATA:  Right-sided numbness since Sunday. EXAM: CT HEAD WITHOUT CONTRAST TECHNIQUE: Contiguous axial  images were obtained from the base of the skull through the vertex without intravenous contrast. COMPARISON:  None. FINDINGS: Brain: Age-indeterminate lacunar infarct in the lateral left thalamus. No evidence of hemorrhage, hydrocephalus, extra-axial collection or mass lesion/mass effect. Vascular: Atherosclerotic vascular calcification of the carotid siphons. No hyperdense vessel. Skull: Normal. Negative for fracture or focal lesion. Sinuses/Orbits: No acute finding. Other: None. IMPRESSION: 1. Age-indeterminate lacunar infarct in the lateral left thalamus. Electronically Signed   By: Titus Dubin M.D.   On: 10/15/2018 12:17     IMPRESSION AND PLAN:   63 year old male with past medical history of atrial fibrillation, diabetes who presented to the hospital due to right-sided numbness.  1.  Acute/subacute CVA-this is the cause of patient's right-sided numbness.  Patient developed the symptoms almost 3 days ago.  His CT head is suggestive of a age indeterminant left lateral thalamus infarct. - We will admit the patient to the hospital, get MRI of the brain without contrast, carotid duplex, echocardiogram. - Patient is already on Xarelto we will not change anticoagulation for now.  We will get neurology consult. -Get PT, OT and speech evaluation. -Check lipid profile.  2.  History of atrial fibrillation-rate controlled. - Continue Xarelto for now.  3.  Diabetes type 2 without complication- hold metformin, Invokana and Januvia for now. -Place the patient on carb controlled diet and sliding scale insulin for now.  All the records are reviewed and case discussed with ED provider. Management plans discussed with the patient, family and they are in agreement.  CODE STATUS: Full code  TOTAL TIME TAKING CARE OF THIS PATIENT: 40 minutes.    Henreitta Leber M.D on 10/15/2018 at 3:24 PM  Between 7am to 6pm - Pager - [phone]  After 6pm go to [email or web address] - password [email or web address]  Office  [phone]  CC: Primary care physician; Mikey College, NP

## 2018-10-15 NOTE — ED Provider Notes (Signed)
[facility] Emergency Department Provider Note       Time seen: ----------------------------------------- 1:16 PM on 10/15/2018 -----------------------------------------   I have reviewed the triage vital signs and the nursing notes.  HISTORY   Chief Complaint Numbness    HPI Jeremy Kelly. is a 63 y.o. male with a history of diabetes, paroxysmal atrial fibrillation who presents to the ED for right-sided numbness on his face and arms since Sunday that has been worsening.  He denies any weakness or facial droop.  He denies any speech changes or trouble swallowing.  He denies any recent illness or other complaints.  Past Medical History:  Diagnosis Date  . Diabetes mellitus with complication ([facility])   . PAF (paroxysmal atrial fibrillation) ([facility])    a. noted on EKG 11/2012; b. CHADS2VASc => 1 (DM)    Patient Active Problem List   Diagnosis Date Noted  . PAC (premature atrial contraction) 09/02/2015  . Renal stones 08/01/2015  . Need for influenza vaccination 08/01/2015  . Uncontrolled type 2 diabetes mellitus with hyperglycemia, without long-term current use of insulin ([facility]) 06/01/2015  . PAF (paroxysmal atrial fibrillation) ([facility]) 06/01/2015  . Encounter to establish care 06/01/2015    Past Surgical History:  Procedure Laterality Date  . TONSILLECTOMY      Allergies Penicillins  Social History Social History   Tobacco Use  . Smoking status: Current Every Day Smoker    Types: Cigars  . Smokeless tobacco: Never Used  . Tobacco comment: 3-4 cigars a day  Substance Use Topics  . Alcohol use: No  . Drug use: No   Review of Systems Constitutional: Negative for fever. Cardiovascular: Negative for chest pain. Respiratory: Negative for shortness of breath. Gastrointestinal: Negative for abdominal pain, vomiting and diarrhea. Musculoskeletal: Negative for back pain. Skin: Negative for rash. Neurological: Positive for numbness  All  systems negative/normal/unremarkable except as stated in the HPI  ____________________________________________   PHYSICAL EXAM:  VITAL SIGNS: ED Triage Vitals  Enc Vitals Group     BP 10/15/18 1154 137/80     Pulse Rate 10/15/18 1154 60     Resp 10/15/18 1154 16     Temp 10/15/18 1154 98.2 F (36.8 C)     Temp Source 10/15/18 1154 Oral     SpO2 10/15/18 1154 98 %     Weight 10/15/18 1145 210 lb (95.3 kg)     Height 10/15/18 1145 5\' 8"  (1.727 m)     Head Circumference --      Peak Flow --      Pain Score 10/15/18 1145 0     Pain Loc --      Pain Edu? --      Excl. in GC? --    Constitutional: Alert and oriented. Well appearing and in no distress. Eyes: Conjunctivae are normal. Normal extraocular movements. ENT      Head: Normocephalic and atraumatic.      Nose: No congestion/rhinnorhea.      Mouth/Throat: Mucous membranes are moist.      Neck: No stridor. Cardiovascular: Normal rate, regular rhythm. No murmurs, rubs, or gallops. Respiratory: Normal respiratory effort without tachypnea nor retractions. Breath sounds are clear and equal bilaterally. No wheezes/rales/rhonchi. Gastrointestinal: Soft and nontender. Normal bowel sounds Musculoskeletal: Nontender with normal range of motion in extremities. No lower extremity tenderness nor edema. Neurologic:  Normal speech and language.  Strength appears to be normal, there is decreased sensation on the right lower face as well as right arm  compared to left.  No pronator drift, no ataxia. Skin:  Skin is warm, dry and intact. No rash noted. Psychiatric: Mood and affect are normal. Speech and behavior are normal.  ____________________________________________  EKG: Interpreted by me.  Sinus rhythm with sinus arrhythmia, rate of 62 bpm, normal PR interval, possible LVH, normal QT  ____________________________________________  ED COURSE:  As part of my medical decision making, I reviewed the following data within the electronic  MEDICAL RECORD NUMBER History obtained from family if available, nursing notes, old chart and ekg, as well as notes from prior ED visits. Patient presented for numbness, we will assess with labs and imaging as indicated at this time.   Procedures  [name] was evaluated in Emergency Department on 10/15/2018 for the symptoms described in the history of present illness. He was evaluated in the context of the global COVID-19 pandemic, which necessitated consideration that the patient might be at risk for infection with the SARS-CoV-2 virus that causes COVID-19. Institutional protocols and algorithms that pertain to the evaluation of patients at risk for COVID-19 are in a state of rapid change based on information released by regulatory bodies including the CDC and federal and state organizations. These policies and algorithms were followed during the patient's care in the ED.  ____________________________________________   LABS (pertinent positives/negatives)  Labs Reviewed  CBC - Abnormal; Notable for the following components:      Result Value   Hemoglobin 17.1 (*)    All other components within normal limits  SARS CORONAVIRUS 2 (HOSPITAL ORDER, PERFORMED IN [facility])  DIFFERENTIAL  PROTIME-INR  APTT  COMPREHENSIVE METABOLIC PANEL    RADIOLOGY Images were viewed by me  CT head IMPRESSION: 1. Age-indeterminate lacunar infarct in the lateral left thalamus.  ____________________________________________   DIFFERENTIAL DIAGNOSIS   CVA, peripheral neuropathy, TIA  FINAL ASSESSMENT AND PLAN  CVA   Plan: The patient had presented for right-sided numbness. Patient's labs did not reveal any acute process. Patient's imaging did reveal a lacunar infarct in the left thalamus.  He is currently on Xarelto, I will discuss with the hospitalist for admission and MRI.   Ulice DashJohnathan E Williams, MD    Note: This note was generated in part or whole with voice recognition  software. Voice recognition is usually quite accurate but there are transcription errors that can and very often do occur. I apologize for any typographical errors that were not detected and corrected.     Emily FilbertWilliams, Jonathan E, MD 10/15/18 [phone]

## 2018-10-16 ENCOUNTER — Observation Stay: Payer: BLUE CROSS/BLUE SHIELD

## 2018-10-16 ENCOUNTER — Observation Stay (HOSPITAL_BASED_OUTPATIENT_CLINIC_OR_DEPARTMENT_OTHER)
Admit: 2018-10-16 | Discharge: 2018-10-16 | Disposition: A | Discharge status: Home / Self Care | Payer: BLUE CROSS/BLUE SHIELD | Attending: Specialist | Admitting: Specialist

## 2018-10-16 DIAGNOSIS — I361 Nonrheumatic tricuspid (valve) insufficiency: Secondary | ICD-10-CM | POA: Diagnosis not present

## 2018-10-16 DIAGNOSIS — I639 Cerebral infarction, unspecified: Secondary | ICD-10-CM | POA: Diagnosis not present

## 2018-10-16 DIAGNOSIS — I340 Nonrheumatic mitral (valve) insufficiency: Secondary | ICD-10-CM

## 2018-10-16 LAB — LIPID PANEL
Cholesterol: 120 mg/dL (ref 0–200)
HDL: 41 mg/dL (ref >40)
LDL Cholesterol: 44 mg/dL (ref 0–99)
Total CHOL/HDL Ratio: 2.9 RATIO
Triglycerides: 174 mg/dL — ABNORMAL HIGH (ref <150)
VLDL: 35 mg/dL (ref 0–40)

## 2018-10-16 LAB — HEMOGLOBIN A1C
Hgb A1c MFr Bld: 7.4 % — ABNORMAL HIGH (ref 4.8–5.6)
Mean Plasma Glucose: 165.68 mg/dL

## 2018-10-16 LAB — GLUCOSE, CAPILLARY
Glucose-Capillary: 155 mg/dL — ABNORMAL HIGH (ref 70–99)
Glucose-Capillary: 158 mg/dL — ABNORMAL HIGH (ref 70–99)

## 2018-10-16 LAB — ECHOCARDIOGRAM COMPLETE
Height: 68 in
Weight: 3280.44 oz

## 2018-10-16 MED ORDER — ASPIRIN EC 81 MG PO TBEC: 81.0000 mg | DELAYED_RELEASE_TABLET | Freq: Every day | ORAL | 1 refills | Status: AC

## 2018-10-16 NOTE — Plan of Care (Signed)
  Problem: Education: Goal: Knowledge of General Education information will improve Description Including pain rating scale, medication(s)/side effects and non-pharmacologic comfort measures Outcome: Progressing   Problem: Elimination: Goal: Will not experience complications related to bowel motility Outcome: Progressing   Problem: Skin Integrity: Goal: Risk for impaired skin integrity will decrease Outcome: Progressing   Problem: Education: Goal: Knowledge of disease or condition will improve Outcome: Progressing Goal: Knowledge of secondary prevention will improve Outcome: Progressing   Problem: Intracerebral Hemorrhage Tissue Perfusion: Goal: Complications of Intracerebral Hemorrhage will be minimized Outcome: Progressing

## 2018-10-16 NOTE — Progress Notes (Signed)
OT Cancellation Note  Patient Details Name: Jeremy Kelly. MRN: [id number] DOB: 24-Aug-1955   Cancelled Treatment:    Reason Eval/Treat Not Completed: OT screened, no needs identified, will sign off(Pt. presents with pins/needles burning sensation in the right hand. Pt. has no RUE or hand limitations affecting engagement in ADLs, and IADL tasks. OT services are not warranted at this time. OT orders to be completed.)  Olegario Messier, MS, OTR/L 10/16/2018, 11:51 AM

## 2018-10-16 NOTE — Consult Note (Signed)
Referring Physician: Verdell Carmine    Chief Complaint: Right sided numbness/pain  HPI: Jeremy Kelly. is an 63 y.o. male with a history of PAF and DM who reports that this past Sunday he developed some right facial numbness and also some right hand and right foot numbness.  Worsened some on yesterday with some development of significant right hand pain as well.  Therefore patient came to the ER for further evaluation.  Initial NIHSS of 0.    Date last known well: Date: 10/12/2018 Time last known well: Unable to determine tPA Given: No: Outside time window  Past Medical History:  Diagnosis Date  . Diabetes mellitus with complication ([place])   . PAF (paroxysmal atrial fibrillation) ([place])    a. noted on EKG 11/2012; b. CHADS2VASc => 1 (DM)    Past Surgical History:  Procedure Laterality Date  . TONSILLECTOMY      Family History  Problem Relation Age of Onset  . Cancer Mother   . Stroke Mother   . Leukemia Father    Social History:  reports that he has been smoking cigars. He has never used smokeless tobacco. He reports that he does not drink alcohol or use drugs.  Allergies:  Allergies  Allergen Reactions  . Penicillins     Medications:  I have reviewed the patient's current medications. Prior to Admission:  Medications Prior to Admission  Medication Sig Dispense Refill Last Dose  . blood glucose meter kit and supplies Dispense based on patient and insurance preference. Use up to two times daily as directed. (FOR ICD-10 E10.9, E11.9). 1 each 0 Taking  . canagliflozin (INVOKANA) 300 MG TABS tablet Take 1 tablet (300 mg total) by mouth daily before breakfast. 90 tablet 1 10/14/2018 at Unknown time  . metFORMIN (GLUCOPHAGE) 1000 MG tablet Take 1 tablet (1,000 mg total) by mouth 2 (two) times daily with a meal. 180 tablet 1 10/14/2018 at Unknown time  . sitaGLIPtin (JANUVIA) 100 MG tablet Take 1 tablet (100 mg total) by mouth daily. 90 tablet 1 10/14/2018 at Unknown time  . XARELTO 20  MG TABS tablet Take 1 tablet (20 mg total) by mouth daily with supper. (Patient taking differently: Take 20 mg by mouth daily with supper. ) 90 tablet 0 10/14/2018 at Unknown time   Scheduled: . insulin aspart  0-5 Units Subcutaneous QHS  . insulin aspart  0-9 Units Subcutaneous TID WC  . rivaroxaban  20 mg Oral Q supper    ROS: History obtained from the patient  General ROS: negative for - chills, fatigue, fever, night sweats, weight gain or weight loss Psychological ROS: negative for - behavioral disorder, hallucinations, memory difficulties, mood swings or suicidal ideation Ophthalmic ROS: negative for - blurry vision, double vision, eye pain or loss of vision ENT ROS: negative for - epistaxis, nasal discharge, oral lesions, sore throat, tinnitus or vertigo Allergy and Immunology ROS: negative for - hives or itchy/watery eyes Hematological and Lymphatic ROS: negative for - bleeding problems, bruising or swollen lymph nodes Endocrine ROS: negative for - galactorrhea, hair pattern changes, polydipsia/polyuria or temperature intolerance Respiratory ROS: negative for - cough, hemoptysis, shortness of breath or wheezing Cardiovascular ROS: negative for - chest pain, dyspnea on exertion, edema or irregular heartbeat Gastrointestinal ROS: negative for - abdominal pain, diarrhea, hematemesis, nausea/vomiting or stool incontinence Genito-Urinary ROS: negative for - dysuria, hematuria, incontinence or urinary frequency/urgency Musculoskeletal ROS: negative for - joint swelling or muscular weakness Neurological ROS: as noted in HPI Dermatological ROS: negative for  rash and skin lesion changes  Physical Examination: Blood pressure 131/78, pulse (!) 57, temperature (!) 97.5 F (36.4 C), temperature source Oral, resp. rate 15, height 5' 8"  (1.727 m), weight 93 kg, SpO2 98 %.  HEENT-  Normocephalic, no lesions, without obvious abnormality.  Normal external eye and conjunctiva.  Normal TM's  bilaterally.  Normal auditory canals and external ears. Normal external nose, mucus membranes and septum.  Normal pharynx. Cardiovascular- S1, S2 normal, pulses palpable throughout   Lungs- chest clear, no wheezing, rales, normal symmetric air entry Abdomen- soft, non-tender; bowel sounds normal; no masses,  no organomegaly Extremities- no edema Lymph-no adenopathy palpable Musculoskeletal-no joint tenderness, deformity or swelling Skin-warm and dry, no hyperpigmentation, vitiligo, or suspicious lesions  Neurological Examination   Mental Status: Alert, oriented, thought content appropriate.  Speech fluent without evidence of aphasia.  Able to follow 3 step commands without difficulty. Cranial Nerves: II: Discs flat bilaterally; Visual fields grossly normal, pupils equal, round, reactive to light and accommodation III,IV, VI: left ptosis, extra-ocular motions intact bilaterally V,VII: smile symmetric, facial light touch sensation decreased on the right VIII: hearing normal bilaterally IX,X: gag reflex present XI: bilateral shoulder shrug XII: midline tongue extension Motor: Right : Upper extremity   5/5    Left:     Upper extremity   5/5  Lower extremity   5/5     Lower extremity   5/5 Tone and bulk:normal tone throughout; no atrophy noted Sensory: Pinprick and light touch decreased on the right Deep Tendon Reflexes: Symmetric throughout Plantars: Right: mute   Left: mute Cerebellar: Normal finger-to-nose and normal heel-to-shin testing bilaterally Gait: not tested due to safety concerns    Laboratory Studies:  Basic Metabolic Panel: Recent Labs  Lab 10/15/18 1205  NA 138  K 4.4  CL 107  CO2 21*  GLUCOSE 189*  BUN 28*  CREATININE 1.20  CALCIUM 9.5    Liver Function Tests: Recent Labs  Lab 10/15/18 1205  AST 21  ALT 27  ALKPHOS 49  BILITOT 0.7  PROT 8.3*  ALBUMIN 4.6   No results for input(s): LIPASE, AMYLASE in the last 168 hours. No results for input(s):  AMMONIA in the last 168 hours.  CBC: Recent Labs  Lab 10/15/18 1205  WBC 6.8  NEUTROABS 4.8  HGB 17.1*  HCT 51.3  MCV 91.9  PLT 197    Cardiac Enzymes: No results for input(s): CKTOTAL, CKMB, CKMBINDEX, TROPONINI in the last 168 hours.  BNP: Invalid input(s): POCBNP  CBG: Recent Labs  Lab 10/15/18 1641 10/15/18 2035 10/16/18 0755  GLUCAP 127* 239* 155*    Microbiology: Results for orders placed or performed during the hospital encounter of 10/15/18  SARS Coronavirus 2 (CEPHEID - Performed in [facility] hospital lab), Hosp Order     Status: None   Collection Time: 10/15/18  1:29 PM  Result Value Ref Range Status   SARS Coronavirus 2 NEGATIVE NEGATIVE Final    Comment: (NOTE) If result is NEGATIVE SARS-CoV-2 target nucleic acids are NOT DETECTED. The SARS-CoV-2 RNA is generally detectable in upper and lower  respiratory specimens during the acute phase of infection. The lowest  concentration of SARS-CoV-2 viral copies this assay can detect is 250  copies / mL. A negative result does not preclude SARS-CoV-2 infection  and should not be used as the sole basis for treatment or other  patient management decisions.  A negative result may occur with  improper specimen collection / handling, submission of specimen other  than nasopharyngeal swab, presence of viral mutation(s) within the  areas targeted by this assay, and inadequate number of viral copies  (<250 copies / mL). A negative result must be combined with clinical  observations, patient history, and epidemiological information. If result is POSITIVE SARS-CoV-2 target nucleic acids are DETECTED. The SARS-CoV-2 RNA is generally detectable in upper and lower  respiratory specimens dur ing the acute phase of infection.  Positive  results are indicative of active infection with SARS-CoV-2.  Clinical  correlation with patient history and other diagnostic information is  necessary to determine patient infection  status.  Positive results do  not rule out bacterial infection or co-infection with other viruses. If result is PRESUMPTIVE POSTIVE SARS-CoV-2 nucleic acids MAY BE PRESENT.   A presumptive positive result was obtained on the submitted specimen  and confirmed on repeat testing.  While 2019 novel coronavirus  (SARS-CoV-2) nucleic acids may be present in the submitted sample  additional confirmatory testing may be necessary for epidemiological  and / or clinical management purposes  to differentiate between  SARS-CoV-2 and other Sarbecovirus currently known to infect humans.  If clinically indicated additional testing with an alternate test  methodology [phone]) is advised. The SARS-CoV-2 RNA is generally  detectable in upper and lower respiratory sp ecimens during the acute  phase of infection. The expected result is Negative. Fact Sheet for Patients:  StrictlyIdeas.no Fact Sheet for Healthcare Providers: BankingDealers.co.za This test is not yet approved or cleared by the [place] FDA and has been authorized for detection and/or diagnosis of SARS-CoV-2 by FDA under an Emergency Use Authorization (EUA).  This EUA will remain in effect (meaning this test can be used) for the duration of the COVID-19 declaration under Section 564(b)(1) of the Act, 21 U.S.C. section 360bbb-3(b)(1), unless the authorization is terminated or revoked sooner. Performed at [facility], [address] [place], [place] [address]     Coagulation Studies: Recent Labs    10/15/18 1205  LABPROT 14.5  INR 1.1    Urinalysis: No results for input(s): COLORURINE, LABSPEC, PHURINE, GLUCOSEU, HGBUR, BILIRUBINUR, KETONESUR, PROTEINUR, UROBILINOGEN, NITRITE, LEUKOCYTESUR in the last 168 hours.  Invalid input(s): APPERANCEUR  Lipid Panel:    Component Value Date/Time   CHOL 120 10/16/2018 0258   CHOL 119 08/05/2015 1302   TRIG 174 (H) 10/16/2018  0258   HDL 41 10/16/2018 0258   HDL 41 08/05/2015 1302   CHOLHDL 2.9 10/16/2018 0258   VLDL 35 10/16/2018 0258   LDLCALC 44 10/16/2018 0258   LDLCALC 68 09/12/2018 0855    HgbA1C:  Lab Results  Component Value Date   HGBA1C 7.2 (H) 09/12/2018    Urine Drug Screen:  No results found for: LABOPIA, COCAINSCRNUR, LABBENZ, AMPHETMU, THCU, LABBARB  Alcohol Level: No results for input(s): ETH in the last 168 hours.  Other results: EKG: sinus arhythmia at 62 bpm.  Imaging: Ct Head Wo Kelly  Result Date: 10/15/2018 CLINICAL DATA:  Right-sided numbness since Sunday. EXAM: CT HEAD WITHOUT Kelly TECHNIQUE: Contiguous axial images were obtained from the base of the skull through the vertex without intravenous Kelly. COMPARISON:  None. FINDINGS: Brain: Age-indeterminate lacunar infarct in the lateral left thalamus. No evidence of hemorrhage, hydrocephalus, extra-axial collection or mass lesion/mass effect. Vascular: Atherosclerotic vascular calcification of the carotid siphons. No hyperdense vessel. Skull: Normal. Negative for fracture or focal lesion. Sinuses/Orbits: No acute finding. Other: None. IMPRESSION: 1. Age-indeterminate lacunar infarct in the lateral left thalamus. Electronically Signed   By: Huntley Dec  Derry M.D.   On: 10/15/2018 12:17   Jeremy Kelly  Result Date: 10/15/2018 CLINICAL DATA:  Right-sided facial and arm numbness. EXAM: MRI HEAD WITHOUT Kelly TECHNIQUE: Multiplanar, multiecho pulse sequences of the brain and surrounding structures were obtained without intravenous Kelly. COMPARISON:  Head CT 10/15/2018 FINDINGS: Brain: A 1 cm acute infarct is present in the lateral aspect of the left thalamus. Scattered small foci of T2 hyperintensity in the cerebral white matter bilaterally are nonspecific but compatible with mild chronic small vessel ischemic disease. The ventricles and sulci are within normal limits for age. No intracranial hemorrhage, mass, midline  shift, or extra-axial fluid collection is identified. Vascular: Major intracranial vascular flow voids are preserved. Skull and upper cervical spine: Unremarkable bone marrow signal. Sinuses/Orbits: Unremarkable orbits. Paranasal sinuses and mastoid air cells are clear. Other: None. IMPRESSION: 1. Acute left thalamic infarct. 2. Mild chronic small vessel ischemic disease. Electronically Signed   By: Logan Bores M.D.   On: 10/15/2018 18:06    Assessment: 63 y.o. male with a history of PAF and DM who presents with right sided weakness and pain.  MRI of the brain reviewed and shows an acute left thalamic infarct consistent with presentation.  Suspect small vessel disease etiology despite the fact that patient with history of afib on Xarelto.  Echocardiogram and carotid dopplers are pending.  A1c 7.4, LDL 44.  Patient on a statin.     Stroke Risk Factors - atrial fibrillation, diabetes mellitus and smoking  Plan: 1. Smoking cessation counseling.  2. Blood sugar management with target A1c<7.0 3. PT consult, OT consult, Speech consult 4. Echocardiogram pending 5. Carotid dopplers pending 6. Prophylactic therapy-Continue Xarelto and start ASA 75m daily 7. NPO until RN stroke swallow screen 8. Telemetry monitoring 9. Frequent neuro checks 10. Patient to follow up with neurology on an outpatient basis.  Patient currently does not wish to start something for his stroke related pain but if it does not improve may wish to have something in the future.    LAlexis Goodell MD Neurology [id number]/12/2018, 11:23 AM

## 2018-10-16 NOTE — Progress Notes (Signed)
Out via w/c  W/o c/o

## 2018-10-16 NOTE — Evaluation (Signed)
Physical Therapy Evaluation Patient Details Name: Jeremy KochBobby Darrell Perras Jr. MRN: [id number] DOB: [birth year] Today's Date: 10/16/2018   History of Present Illness  Pt with PMH of DM, PAF, presented to ED due to R sided face, hand, and foot numbness. MRI showed acute L thalamic infarct.    Clinical Impression  Pt up in chair at start of session, A&Ox4, behavior WFLs, able to provide clear PLOF information. Pt independent at baseline, works full time, no falls in the last 6 months.   Upon assessment patient continued to complain of R sided face, hand, and foot numbness, reported "burning" with light touch sensation to R hand both palmar and dorsal aspects. R foot results more unclear but patient reported numbness. Pt educated about desensitization techniques. Strength, gait, and balance WFLs, safe, steady. higher level balance tested, pt demonstrated normal balance, no sway/ LOB with tandem walking, sudden stops, pivot  turns, gait speed changes. The patient demonstrated and reported return to baseline level of functioning, no further acute PT needs indicated. PT to sign off. Please reconsult PT if pt status changes or acute needs are identified.      Follow Up Recommendations No PT follow up    Equipment Recommendations  None recommended by PT    Recommendations for Other Services       Precautions / Restrictions Precautions Precautions: None Restrictions Weight Bearing Restrictions: No      Mobility  Bed Mobility               General bed mobility comments: deferred up in chair  Transfers Overall transfer level: Independent                  Ambulation/Gait Ambulation/Gait assistance: Independent Gait Distance (Feet): 380 Feet   Gait Pattern/deviations: WFL(Within Functional Limits)   Gait velocity interpretation: >4.37 ft/sec, indicative of normal walking speed    Stairs            Wheelchair Mobility    Modified Rankin (Stroke Patients Only)        Balance Overall balance assessment: Independent                                           Pertinent Vitals/Pain Pain Assessment: No/denies pain    Home Living Family/patient expects to be discharged to:: Private residence Living Arrangements: Spouse/significant other Available Help at Discharge: Family Type of Home: House Home Access: Stairs to enter Entrance Stairs-Rails: None Entrance Stairs-Number of Steps: 3-4 Home Layout: One level Home Equipment: None      Prior Function Level of Independence: Independent         Comments: works full time, several jobs, denies falls.     Hand Dominance   Dominant Hand: Right    Extremity/Trunk Assessment   Upper Extremity Assessment Upper Extremity Assessment: LUE deficits/detail;RUE deficits/detail RUE Deficits / Details: strength 5/5, pt reported decreased sensation to diffuse R hand and forearm, both palmar and dorsal LUE Deficits / Details: strength 5/5    Lower Extremity Assessment Lower Extremity Assessment: LLE deficits/detail;RLE deficits/detail RLE Deficits / Details: grossly 5/5 LLE Deficits / Details: grossly 5/5       Communication   Communication: No difficulties  Cognition Arousal/Alertness: Awake/alert Behavior During Therapy: WFL for tasks assessed/performed Overall Cognitive Status: Within Functional Limits for tasks assessed  General Comments General comments (skin integrity, edema, etc.): higher level balance tested, pt demonstrated normal balance, no sway/ LOB with tandem walking, sudden stops, pivot  turns, gait speed changes    Exercises     Assessment/Plan    PT Assessment Patent does not need any further PT services  PT Problem List         PT Treatment Interventions      PT Goals (Current goals can be found in the Care Plan section)       Frequency     Barriers to discharge        Co-evaluation                AM-PAC PT "6 Clicks" Mobility  Outcome Measure Help needed turning from your back to your side while in a flat bed without using bedrails?: None Help needed moving from lying on your back to sitting on the side of a flat bed without using bedrails?: None Help needed moving to and from a bed to a chair (including a wheelchair)?: None Help needed standing up from a chair using your arms (e.g., wheelchair or bedside chair)?: None Help needed to walk in hospital room?: None Help needed climbing 3-5 steps with a railing? : None 6 Click Score: 24    End of Session Equipment Utilized During Treatment: Gait belt Activity Tolerance: Patient tolerated treatment well Patient left: in chair Nurse Communication: Mobility status PT Visit Diagnosis: Other symptoms and signs involving the nervous system (R29.898)    Time: 7371-0626 PT Time Calculation (min) (ACUTE ONLY): 12 min   Charges:   PT Evaluation $PT Eval Low Complexity: 1 Low          Olga Coaster PT, DPT 9:41 AM,10/16/18 [phone]

## 2018-10-16 NOTE — Progress Notes (Signed)
*  PRELIMINARY RESULTS* Echocardiogram 2D Echocardiogram has been performed.  Jeremy Kelly 10/16/2018, 12:03 PM

## 2018-10-16 NOTE — Progress Notes (Signed)
SLP Cancellation Note  Patient Details Name: Jeremy Kelly. MRN: [id number] DOB: January 08, 1956   Cancelled treatment:       Reason Eval/Treat Not Completed: SLP screened, no needs identified, will sign off(chart reviewed; consulted NSG then met w/ pt in room). Pt denied any difficulty swallowing and is currently on a regular consistency diet(eating a burger and fries while in room talking w/ SLP); tolerates swallowing pills w/ water per NSG. Pt conversed at conversational level w/out deficits noted; pt and NSG denied any speech-language deficits.  No further skilled ST services indicated as pt appears at her baseline. Pt agreed. NSG to reconsult if any change in status while admitted.     Orinda Kenner, [place], CCC-SLP Raylie Maddison 10/16/2018, 12:22 PM

## 2018-10-16 NOTE — Progress Notes (Signed)
Pt for discharge home. Instructions to pt. presc and home meds discussed/ diet / activity and f/u discussed. Pt took iv  Out and moniter off.  Verbalize  Discharge instructions .

## 2018-10-16 NOTE — Discharge Summary (Signed)
[facility] NAME: Jeremy Kelly    MR#:  [id number]  DATE OF BIRTH:  1956/03/21  DATE OF ADMISSION:  10/15/2018 ADMITTING PHYSICIAN: Henreitta Leber, MD  DATE OF DISCHARGE: 10/16/2018  2:58 PM  PRIMARY CARE PHYSICIAN: Mikey College, NP    ADMISSION DIAGNOSIS:  Cerebrovascular accident (CVA), unspecified mechanism ([place]) [I63.9]  DISCHARGE DIAGNOSIS:  Active Problems:   CVA (cerebral vascular accident) ([place])   SECONDARY DIAGNOSIS:   Past Medical History:  Diagnosis Date  . Diabetes mellitus with complication ([place])   . PAF (paroxysmal atrial fibrillation) ([place])    a. noted on EKG 11/2012; b. CHADS2VASc => 1 (DM)    HOSPITAL COURSE:   63 year old male with past medical history of atrial fibrillation, diabetes who presented to the hospital due to right-sided numbness.  1.  Acute/subacute CVA-this was the cause of patient's right-sided numbness.  Patient developed the symptoms almost 3 days prior to admission.  His CT head was suggestive of a age indeterminant left lateral thalamus infarct. Patient was admitted to the hospital underwent an MRI of the brain which was positive for a left thalamic CVA. -Neurology was consulted and the recommended adding aspirin to the patient's previous Xarelto.  Patient's carotid duplex showed no hemodynamically significant carotid stenosis and echocardiogram showed no evidence of thrombus with normal EF. -Patient also seen by speech and PT and OT did not require any services. -She is currently stable and being discharged on Xarelto and aspirin.  2.  History of atrial fibrillation-rate controlled. - Continue Xarelto for now.  3.  Diabetes type 2 without complication- resume metformin, Invokana and Januvia upon discharge.   DISCHARGE CONDITIONS:   Stable.   CONSULTS OBTAINED:  Treatment Team:  Catarina Hartshorn, MD  DRUG ALLERGIES:   Allergies  Allergen Reactions  . Penicillins      DISCHARGE MEDICATIONS:   Allergies as of 10/16/2018      Reactions   Penicillins       Medication List    TAKE these medications   aspirin EC 81 MG tablet Take 1 tablet (81 mg total) by mouth daily.   blood glucose meter kit and supplies Dispense based on patient and insurance preference. Use up to two times daily as directed. (FOR ICD-10 E10.9, E11.9).   canagliflozin 300 MG Tabs tablet Commonly known as:  Invokana Take 1 tablet (300 mg total) by mouth daily before breakfast.   metFORMIN 1000 MG tablet Commonly known as:  GLUCOPHAGE Take 1 tablet (1,000 mg total) by mouth 2 (two) times daily with a meal.   sitaGLIPtin 100 MG tablet Commonly known as:  Januvia Take 1 tablet (100 mg total) by mouth daily.   Xarelto 20 MG Tabs tablet Generic drug:  rivaroxaban Take 1 tablet (20 mg total) by mouth daily with supper. What changed:  See the new instructions.         DISCHARGE INSTRUCTIONS:   DIET:  Diabetic diet  DISCHARGE CONDITION:  Stable  ACTIVITY:  Activity as tolerated  OXYGEN:  Home Oxygen: No.   Oxygen Delivery: room air  DISCHARGE LOCATION:  home   If you experience worsening of your admission symptoms, develop shortness of breath, life threatening emergency, suicidal or homicidal thoughts you must seek medical attention immediately by calling 911 or calling your MD immediately  if symptoms less severe.  You Must read complete instructions/literature along with all the possible adverse reactions/side effects for all the Medicines you take  and that have been prescribed to you. Take any new Medicines after you have completely understood and accpet all the possible adverse reactions/side effects.   Please note  You were cared for by a hospitalist during your hospital stay. If you have any questions about your discharge medications or the care you received while you were in the hospital after you are discharged, you can call the unit and asked to  speak with the hospitalist on call if the hospitalist that took care of you is not available. Once you are discharged, your primary care physician will handle any further medical issues. Please note that NO REFILLS for any discharge medications will be authorized once you are discharged, as it is imperative that you return to your primary care physician (or establish a relationship with a primary care physician if you do not have one) for your aftercare needs so that they can reassess your need for medications and monitor your lab values.     Today   No acute events overnight.  Numbness on the right side has improved since yesterday.  No focal weakness.  Denies any other associated symptoms.  Will discharge home today.  VITAL SIGNS:  Blood pressure 131/78, pulse (!) 57, temperature (!) 97.5 F (36.4 C), temperature source Oral, resp. rate 15, height 5' 8"  (1.727 m), weight 93 kg, SpO2 98 %.  I/O:    Intake/Output Summary (Last 24 hours) at 10/16/2018 1500 Last data filed at 10/16/2018 1346 Gross per 24 hour  Intake 480 ml  Output -  Net 480 ml    PHYSICAL EXAMINATION:  GENERAL:  63 y.o.-year-old patient lying in the bed with no acute distress.  EYES: Pupils equal, round, reactive to light and accommodation. No scleral icterus. Extraocular muscles intact.  HEENT: Head atraumatic, normocephalic. Oropharynx and nasopharynx clear.  NECK:  Supple, no jugular venous distention. No thyroid enlargement, no tenderness.  LUNGS: Normal breath sounds bilaterally, no wheezing, rales,rhonchi. No use of accessory muscles of respiration.  CARDIOVASCULAR: S1, S2 normal. No murmurs, rubs, or gallops.  ABDOMEN: Soft, non-tender, non-distended. Bowel sounds present. No organomegaly or mass.  EXTREMITIES: No pedal edema, cyanosis, or clubbing.  NEUROLOGIC: Cranial nerves II through XII are intact. Right facial numbness along with right foot and hand numbness. No focal weakness.  PSYCHIATRIC: The patient is  alert and oriented x 3.  SKIN: No obvious rash, lesion, or ulcer.   DATA REVIEW:   CBC Recent Labs  Lab 10/15/18 1205  WBC 6.8  HGB 17.1*  HCT 51.3  PLT 197    Chemistries  Recent Labs  Lab 10/15/18 1205  NA 138  K 4.4  CL 107  CO2 21*  GLUCOSE 189*  BUN 28*  CREATININE 1.20  CALCIUM 9.5  AST 21  ALT 27  ALKPHOS 49  BILITOT 0.7    Cardiac Enzymes No results for input(s): TROPONINI in the last 168 hours.  Microbiology Results  Results for orders placed or performed during the hospital encounter of 10/15/18  SARS Coronavirus 2 (CEPHEID - Performed in [facility] hospital lab), Hosp Order     Status: None   Collection Time: 10/15/18  1:29 PM  Result Value Ref Range Status   SARS Coronavirus 2 NEGATIVE NEGATIVE Final    Comment: (NOTE) If result is NEGATIVE SARS-CoV-2 target nucleic acids are NOT DETECTED. The SARS-CoV-2 RNA is generally detectable in upper and lower  respiratory specimens during the acute phase of infection. The lowest  concentration of SARS-CoV-2 viral copies  this assay can detect is 250  copies / mL. A negative result does not preclude SARS-CoV-2 infection  and should not be used as the sole basis for treatment or other  patient management decisions.  A negative result may occur with  improper specimen collection / handling, submission of specimen other  than nasopharyngeal swab, presence of viral mutation(s) within the  areas targeted by this assay, and inadequate number of viral copies  (<250 copies / mL). A negative result must be combined with clinical  observations, patient history, and epidemiological information. If result is POSITIVE SARS-CoV-2 target nucleic acids are DETECTED. The SARS-CoV-2 RNA is generally detectable in upper and lower  respiratory specimens dur ing the acute phase of infection.  Positive  results are indicative of active infection with SARS-CoV-2.  Clinical  correlation with patient history and other  diagnostic information is  necessary to determine patient infection status.  Positive results do  not rule out bacterial infection or co-infection with other viruses. If result is PRESUMPTIVE POSTIVE SARS-CoV-2 nucleic acids MAY BE PRESENT.   A presumptive positive result was obtained on the submitted specimen  and confirmed on repeat testing.  While 2019 novel coronavirus  (SARS-CoV-2) nucleic acids may be present in the submitted sample  additional confirmatory testing may be necessary for epidemiological  and / or clinical management purposes  to differentiate between  SARS-CoV-2 and other Sarbecovirus currently known to infect humans.  If clinically indicated additional testing with an alternate test  methodology [phone]) is advised. The SARS-CoV-2 RNA is generally  detectable in upper and lower respiratory sp ecimens during the acute  phase of infection. The expected result is Negative. Fact Sheet for Patients:  StrictlyIdeas.no Fact Sheet for Healthcare Providers: BankingDealers.co.za This test is not yet approved or cleared by the [place] FDA and has been authorized for detection and/or diagnosis of SARS-CoV-2 by FDA under an Emergency Use Authorization (EUA).  This EUA will remain in effect (meaning this test can be used) for the duration of the COVID-19 declaration under Section 564(b)(1) of the Act, 21 U.S.C. section 360bbb-3(b)(1), unless the authorization is terminated or revoked sooner. Performed at [facility], [address] [place], [place] [address]     RADIOLOGY:  Ct Head Wo Contrast  Result Date: 10/15/2018 CLINICAL DATA:  Right-sided numbness since Sunday. EXAM: CT HEAD WITHOUT CONTRAST TECHNIQUE: Contiguous axial images were obtained from the base of the skull through the vertex without intravenous contrast. COMPARISON:  None. FINDINGS: Brain: Age-indeterminate lacunar infarct in the lateral left  thalamus. No evidence of hemorrhage, hydrocephalus, extra-axial collection or mass lesion/mass effect. Vascular: Atherosclerotic vascular calcification of the carotid siphons. No hyperdense vessel. Skull: Normal. Negative for fracture or focal lesion. Sinuses/Orbits: No acute finding. Other: None. IMPRESSION: 1. Age-indeterminate lacunar infarct in the lateral left thalamus. Electronically Signed   By: Titus Dubin M.D.   On: 10/15/2018 12:17   Mr Brain Wo Contrast  Result Date: 10/15/2018 CLINICAL DATA:  Right-sided facial and arm numbness. EXAM: MRI HEAD WITHOUT CONTRAST TECHNIQUE: Multiplanar, multiecho pulse sequences of the brain and surrounding structures were obtained without intravenous contrast. COMPARISON:  Head CT 10/15/2018 FINDINGS: Brain: A 1 cm acute infarct is present in the lateral aspect of the left thalamus. Scattered small foci of T2 hyperintensity in the cerebral white matter bilaterally are nonspecific but compatible with mild chronic small vessel ischemic disease. The ventricles and sulci are within normal limits for age. No intracranial hemorrhage, mass, midline shift, or extra-axial  fluid collection is identified. Vascular: Major intracranial vascular flow voids are preserved. Skull and upper cervical spine: Unremarkable bone marrow signal. Sinuses/Orbits: Unremarkable orbits. Paranasal sinuses and mastoid air cells are clear. Other: None. IMPRESSION: 1. Acute left thalamic infarct. 2. Mild chronic small vessel ischemic disease. Electronically Signed   By: Logan Bores M.D.   On: 10/15/2018 18:06   US Carotid Bilateral (at [facility] Only)  Result Date: 10/16/2018 CLINICAL DATA:  63 year old male with CVA EXAM: BILATERAL CAROTID DUPLEX ULTRASOUND TECHNIQUE: Pearline Cables scale imaging, color Doppler and duplex ultrasound were performed of bilateral carotid and vertebral arteries in the neck. COMPARISON:  No prior duplex FINDINGS: Criteria: Quantification of carotid stenosis is based on  velocity parameters that correlate the residual internal carotid diameter with NASCET-based stenosis levels, using the diameter of the distal internal carotid lumen as the denominator for stenosis measurement. The following velocity measurements were obtained: RIGHT ICA:  Systolic 61 cm/sec, Diastolic 14 cm/sec CCA:  87 cm/sec SYSTOLIC ICA/CCA RATIO:  0.7 ECA:  150 cm/sec LEFT ICA:  Systolic 98 cm/sec, Diastolic 26 cm/sec CCA:  144 cm/sec SYSTOLIC ICA/CCA RATIO:  1.0 ECA:  105 cm/sec Right Brachial SBP: Not acquired Left Brachial SBP: Not acquired RIGHT CAROTID ARTERY: No significant calcifications of the right common carotid artery. Intermediate waveform maintained. Heterogeneous and partially calcified plaque at the right carotid bifurcation. No significant lumen shadowing. Low resistance waveform of the right ICA. No significant tortuosity. RIGHT VERTEBRAL ARTERY: Antegrade flow with low resistance waveform. LEFT CAROTID ARTERY: No significant calcifications of the left common carotid artery. Intermediate waveform maintained. Heterogeneous and partially calcified plaque at the left carotid bifurcation without significant lumen shadowing. Low resistance waveform of the left ICA. No significant tortuosity. LEFT VERTEBRAL ARTERY:  Antegrade flow with low resistance waveform. IMPRESSION: Color duplex indicates minimal heterogeneous and calcified plaque, with no hemodynamically significant stenosis by duplex criteria in the extracranial cerebrovascular circulation. Signed, Dulcy Fanny. Dellia Nims, RPVI [facility] Electronically Signed   By: Corrie Mckusick D.O.   On: 10/16/2018 13:56      Management plans discussed with the patient, family and they are in agreement.  CODE STATUS:     Code Status Orders  (From admission, onward)         Start     Ordered   10/15/18 1629  Full code  Continuous     10/15/18 1628         TOTAL TIME TAKING CARE OF  THIS PATIENT: 40 minutes.    Henreitta Leber M.D on 10/16/2018 at 3:00 PM  Between 7am to 6pm - Pager - [phone]  After 6pm go to [email or web address] - Proofreader  [facility]  Office  [phone]  CC: Primary care physician; Mikey College, NP

## 2018-10-17 LAB — HIV ANTIBODY (ROUTINE TESTING W REFLEX): HIV Screen 4th Generation wRfx: NONREACTIVE

## 2018-10-19 NOTE — Progress Notes (Deleted)
{Choose 1 Note Type (Telehealth Visit or Telephone Visit):[phone]}   I connected with  Jeremy Heck. on 10/19/18 by a video enabled telemedicine application and verified that I am speaking with the correct person using two identifiers. I discussed the limitations of evaluation and management by telemedicine. The patient expressed understanding and agreed to proceed.   Evaluation Performed:  Follow-up visit  Date:  10/19/2018   ID:  Jeremy Heck., DOB [birth year], MRN [id number]  Patient Location:  [place] [address]   Provider location:   [facility], [place] office  PCP:  Mikey College, NP  Cardiologist:  Arvid Right [facility]   Chief Complaint:      History of Present Illness:    Jeremy Kelly. is a 63 y.o. male who presents via audio/video conferencing for a telehealth visit today.   The patient does not symptoms concerning for COVID-19 infection (fever, chills, cough, or new SHORTNESS OF BREATH).   Patient has a past medical history of DM,  LHC 11/2012,  PAF diagnosed in 11/2012 on EKG  On Xarelto who presents for routine follow-up of his atrial fibrillation  Last seen by cardiology clinic January 2019  Recent hospitalization for stroke May 2020   MRI of the brain reviewed and shows an acute left thalamic infarct consistent with presentation.   Neurology suspected  small vessel disease etiology despite the fact that patient with history of afib on Xarelto.      A1c 7.4, LDL 44.   Echocardiogram May 2020  1. The left ventricle has normal systolic function, with an ejection fraction of 55-60%. The cavity size was normal. Left ventricular diastolic Doppler parameters are indeterminate.  2. The right ventricle has normal systolic function. The cavity was normal. There is no increase in right ventricular wall thickness.Unable to estimate RVSP, though appears grossly normal.  3. Negative saline contrast  bubble study  Carotid ultrasound No significant calcifications of the left or right common carotid artery.   right facial numbness and also some right hand and right foot numbness. Worsened some on yesterday with some development of significant right hand pain as well.  seen by his PCP in 11/2012 when on exam he was noted to have an irregular rhythm. EKG was performed at that time that documented Afib      Prior CV studies:   The following studies were reviewed today:    Past Medical History:  Diagnosis Date  . Diabetes mellitus with complication ([place])   . PAF (paroxysmal atrial fibrillation) ([place])    a. noted on EKG 11/2012; b. CHADS2VASc => 1 (DM)   Past Surgical History:  Procedure Laterality Date  . TONSILLECTOMY       No outpatient medications have been marked as taking for the 10/20/18 encounter (Appointment) with Minna Merritts, MD.     Allergies:   Penicillins   Social History   Tobacco Use  . Smoking status: Current Every Day Smoker    Types: Cigars  . Smokeless tobacco: Never Used  . Tobacco comment: 3-4 cigars a day  Substance Use Topics  . Alcohol use: No  . Drug use: No     Current Outpatient Medications on File Prior to Visit  Medication Sig Dispense Refill  . aspirin EC 81 MG tablet Take 1 tablet (81 mg total) by mouth daily. 30 tablet 1  . blood glucose meter kit and supplies Dispense based on patient and insurance preference. Use  up to two times daily as directed. (FOR ICD-10 E10.9, E11.9). 1 each 0  . canagliflozin (INVOKANA) 300 MG TABS tablet Take 1 tablet (300 mg total) by mouth daily before breakfast. 90 tablet 1  . metFORMIN (GLUCOPHAGE) 1000 MG tablet Take 1 tablet (1,000 mg total) by mouth 2 (two) times daily with a meal. 180 tablet 1  . sitaGLIPtin (JANUVIA) 100 MG tablet Take 1 tablet (100 mg total) by mouth daily. 90 tablet 1  . XARELTO 20 MG TABS tablet Take 1 tablet (20 mg total) by mouth daily with supper. (Patient taking  differently: Take 20 mg by mouth daily with supper. ) 90 tablet 0   No current facility-administered medications on file prior to visit.      Family Hx: The patient's family history includes Cancer in his mother; Leukemia in his father; Stroke in his mother.  ROS:   Please see the history of present illness.    ROS    Labs/Other Tests and Data Reviewed:    Recent Labs: 10/15/2018: ALT 27; BUN 28; Creatinine, Ser 1.20; Hemoglobin 17.1; Platelets 197; Potassium 4.4; Sodium 138   Recent Lipid Panel Lab Results  Component Value Date/Time   CHOL 120 10/16/2018 02:58 AM   CHOL 119 08/05/2015 01:02 PM   TRIG 174 (H) 10/16/2018 02:58 AM   HDL 41 10/16/2018 02:58 AM   HDL 41 08/05/2015 01:02 PM   CHOLHDL 2.9 10/16/2018 02:58 AM   LDLCALC 44 10/16/2018 02:58 AM   LDLCALC 68 09/12/2018 08:55 AM   LDLDIRECT 67 06/17/2017 04:26 PM    Wt Readings from Last 3 Encounters:  10/15/18 205 lb 0.4 oz (93 kg)  06/09/18 218 lb 3.2 oz (99 kg)  06/17/17 218 lb (98.9 kg)     Exam:    Vital Signs: Vital signs may also be detailed in the HPI There were no vitals taken for this visit.  Wt Readings from Last 3 Encounters:  10/15/18 205 lb 0.4 oz (93 kg)  06/09/18 218 lb 3.2 oz (99 kg)  06/17/17 218 lb (98.9 kg)   Temp Readings from Last 3 Encounters:  10/16/18 (!) 97.5 F (36.4 C) (Oral)  06/09/18 97.9 F (36.6 C) (Oral)  04/09/16 97.6 F (36.4 C) (Oral)   BP Readings from Last 3 Encounters:  10/16/18 131/78  06/09/18 134/89  06/17/17 124/70   Pulse Readings from Last 3 Encounters:  10/16/18 (!) 57  06/09/18 (!) 54  06/17/17 66     Well nourished, well developed male in no acute distress. Constitutional:  oriented to person, place, and time. No distress.  Head: Normocephalic and atraumatic.  Eyes:  no discharge. No scleral icterus.  Neck: Normal range of motion. Neck supple.  Pulmonary/Chest: No audible wheezing, no distress, appears comfortable Musculoskeletal: Normal range  of motion.  no  tenderness or deformity.  Neurological:   Coordination normal. Full exam not performed Skin:  No rash Psychiatric:  normal mood and affect. behavior is normal. Thought content normal.    ASSESSMENT & PLAN:    No diagnosis found.   COVID-19 Education: The signs and symptoms of COVID-19 were discussed with the patient and how to seek care for testing (follow up with PCP or arrange E-visit).  The importance of social distancing was discussed today.  Patient Risk:   After full review of this patients clinical status, I feel that they are at least moderate risk at this time.  Time:   Today, I have spent 25 minutes with the patient  with telehealth technology discussing the cardiac and medical problems/diagnoses detailed above   10 min spent reviewing the chart prior to patient visit today   Medication Adjustments/Labs and Tests Ordered: Current medicines are reviewed at length with the patient today.  Concerns regarding medicines are outlined above.   Tests Ordered: No tests ordered   Medication Changes: No changes made   Disposition: Follow-up in 6 months   Signed, Ida Rogue, MD  10/19/2018 1:31 PM    [place] Office [address], [place], [place] [address]

## 2018-10-20 ENCOUNTER — Telehealth: Payer: BLUE CROSS/BLUE SHIELD | Admitting: Cardiovascular Disease

## 2018-10-20 ENCOUNTER — Telehealth: Payer: Self-pay | Admitting: Cardiovascular Disease

## 2018-10-20 ENCOUNTER — Other Ambulatory Visit: Payer: Self-pay

## 2018-10-20 NOTE — Telephone Encounter (Signed)
Patient cancelled appt after notified [employer] home plan is oon with [facility].  He declines to be seen and will call in the future if needing an appt.    Patient aware pcp office is in network but all other [facility] providers are not and the [employer] home plan with [facility] wants him to see in network providers there.    Deleting recall

## 2018-10-21 NOTE — Progress Notes (Signed)
Virtual Visit via Video Note   This visit type was conducted due to national recommendations for restrictions regarding the COVID-19 Pandemic (e.g. social distancing) in an effort to limit this patient's exposure and mitigate transmission in our community.  Due to his co-morbid illnesses, this patient is at least at moderate risk for complications without adequate follow up.  This format is felt to be most appropriate for this patient at this time.  All issues noted in this document were discussed and addressed.  A limited physical exam was performed with this format.  Please refer to the patient's chart for his consent to telehealth for [facility].   I connected with  Jeremy Heck. on 10/22/18 by a video enabled telemedicine application and verified that I am speaking with the correct person using two identifiers. I discussed the limitations of evaluation and management by telemedicine. The patient expressed understanding and agreed to proceed.   Evaluation Performed:  Follow-up visit  Date:  10/22/2018   ID:  Jeremy Heck., DOB 23-Oct-1955, MRN [id number]  Patient Location:  [place] [address]   Provider location:   [facility], [place] office  PCP:  Mikey College, NP  Cardiologist:  [facility]   Chief Complaint:  Recent CVA    History of Present Illness:    Jeremy Darrell Keiji Melland. is a 63 y.o. male who presents via audio/video conferencing for a telehealth visit today.   The patient does not symptoms concerning for COVID-19 infection (fever, chills, cough, or new SHORTNESS OF BREATH).   Patient has a past medical history of paroxysmal atrial fibrillation , on chronic Xarelto Atrial fibrillation seen on EKG June 2014 Diabetes type 2 who presents for routine follow-up of his atrial fibrillation, CVA  Last seen in the office December 2017  Recent hospital admission May 2020 for stroke Right-sided  numbness 3 days prior to admission  right-sided numbness. Patient developed the symptoms almost 3 days prior to admission. His CT head was suggestive of a age indeterminant left lateral thalamus infarct. Patient was admitted to the hospital underwent an MRI of the brain which was positive for a left thalamic CVA. -Neurology was consulted and the recommended adding aspirin to the patient's previous Xarelto.  Patient's carotid duplex showed no hemodynamically significant carotid stenosis and echocardiogram showed no evidence of thrombus with normal EF.  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   Mom with CVAs  Prior CV studies:   The following studies were reviewed today:  LHC in 11/2012 showed normal coronary arteries, EF 61%  Past Medical History:  Diagnosis Date  . Diabetes mellitus with complication ([place])   . PAF (paroxysmal atrial fibrillation) ([place])    a. noted on EKG 11/2012; b. CHADS2VASc => 1 (DM)   Past Surgical History:  Procedure Laterality Date  . TONSILLECTOMY       Current Meds  Medication Sig  . aspirin EC 81 MG tablet Take 1 tablet (81 mg total) by mouth daily.  . blood glucose meter kit and supplies Dispense based on patient and insurance preference. Use up to two  times daily as directed. (FOR ICD-10 E10.9, E11.9).  . canagliflozin (INVOKANA) 300 MG TABS tablet Take 1 tablet (300 mg total) by mouth daily before breakfast.  . metFORMIN (GLUCOPHAGE) 1000 MG tablet Take 1 tablet (1,000 mg total) by mouth 2 (two) times daily with a meal.  . sitaGLIPtin (JANUVIA) 100 MG tablet Take 1 tablet (100 mg total) by mouth daily.  Alveda Reasons 20 MG TABS tablet Take 1 tablet (20 mg total) by mouth daily with supper. (Patient taking differently: Take 20 mg by mouth daily with supper. )     Allergies:   Penicillins   Social History   Tobacco Use  . Smoking status: Current Every Day Smoker    Types: Cigars  . Smokeless tobacco: Never Used  . Tobacco comment: 3-4 cigars a day  Substance Use Topics  . Alcohol use: No  . Drug use: No     Current Outpatient Medications on File Prior to Visit  Medication Sig Dispense Refill  . aspirin EC 81 MG tablet Take 1 tablet (81 mg total) by mouth daily. 30 tablet 1  . blood glucose meter kit and supplies Dispense based on patient and insurance preference. Use up to two times daily as directed. (FOR ICD-10 E10.9, E11.9). 1 each 0  . canagliflozin (INVOKANA) 300 MG TABS tablet Take 1 tablet (300 mg total) by mouth daily before breakfast. 90 tablet 1  . metFORMIN (GLUCOPHAGE) 1000 MG tablet Take 1 tablet (1,000 mg total) by mouth 2 (two) times daily with a meal. 180 tablet 1  . sitaGLIPtin (JANUVIA) 100 MG tablet Take 1 tablet (100 mg total) by mouth daily. 90 tablet 1  . XARELTO 20 MG TABS tablet Take 1 tablet (20 mg total) by mouth daily with supper. (Patient taking differently: Take 20 mg by mouth daily with supper. ) 90 tablet 0   No current facility-administered medications on file prior to visit.      Family Hx: The patient's family history includes Cancer in his mother; Leukemia in his father; Stroke in his mother.  ROS:   Please see the history of present illness.    Review of Systems  Constitutional: Negative.   Respiratory: Negative.   Cardiovascular: Negative.   Gastrointestinal: Negative.   Musculoskeletal: Negative.   Neurological: Negative.        Right side numbness  Psychiatric/Behavioral: Negative.   All other systems reviewed and are negative.     Labs/Other Tests and Data Reviewed:    Recent Labs: 10/15/2018: ALT 27; BUN 28; Creatinine, Ser 1.20;  Hemoglobin 17.1; Platelets 197; Potassium 4.4; Sodium 138   Recent Lipid Panel Lab Results  Component Value Date/Time   CHOL 120 10/16/2018 02:58 AM   CHOL 119 08/05/2015 01:02 PM   TRIG 174 (H) 10/16/2018 02:58 AM   HDL 41 10/16/2018 02:58 AM   HDL 41 08/05/2015 01:02 PM   CHOLHDL 2.9 10/16/2018 02:58 AM   LDLCALC 44 10/16/2018 02:58 AM   LDLCALC 68 09/12/2018 08:55 AM   LDLDIRECT 67 06/17/2017 04:26 PM    Wt Readings from Last 3 Encounters:  10/22/18 205 lb (93 kg)  10/15/18 205 lb 0.4 oz (93 kg)  06/09/18 218 lb 3.2 oz (99 kg)     Exam:    Vital Signs: Vital signs may also be detailed in the HPI Ht '5\' 8"'$  (1.727 m)   Wt 205 lb (93 kg)   BMI 31.17 kg/m   Wt Readings from Last 3 Encounters:  10/22/18 205 lb (  93 kg)  10/15/18 205 lb 0.4 oz (93 kg)  06/09/18 218 lb 3.2 oz (99 kg)   Temp Readings from Last 3 Encounters:  10/16/18 (!) 97.5 F (36.4 C) (Oral)  06/09/18 97.9 F (36.6 C) (Oral)  04/09/16 97.6 F (36.4 C) (Oral)   BP Readings from Last 3 Encounters:  10/16/18 131/78  06/09/18 134/89  06/17/17 124/70   Pulse Readings from Last 3 Encounters:  10/16/18 (!) 57  06/09/18 (!) 54  06/17/17 66    BP: 130s/70, HR 50 to 60 resp 16  Well nourished, well developed male in no acute distress. Constitutional:  oriented to person, place, and time. No distress.    ASSESSMENT & PLAN:    PAF (paroxysmal atrial fibrillation) (HCC) Continue anticoagulation with Xarelto  Type 2 diabetes mellitus without complication, without long-term current use of insulin (HCC) Stressed importance of aggressive diabetes control Hemoglobin A1c from 11 down to 7  Cerebrovascular accident (CVA), unspecified mechanism ([place]) Recent hospital records reviewed Residual right-sided numbness, some tingling and pain in his right hand is most bothersome If symptoms persist might need Neurontin.  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  The importance of social distancing was discussed today.  Patient Risk:   After full review of this patients clinical status, I feel that they are at least moderate risk at this time.  Time:   Today, I have spent 25 minutes with the patient with telehealth technology discussing the cardiac and medical problems/diagnoses detailed above   10 min spent reviewing the chart prior to patient visit today   Medication Adjustments/Labs and Tests Ordered: Current medicines are reviewed at length with the patient today.  Concerns regarding medicines are outlined above.   Tests Ordered: No tests ordered   Medication Changes: No changes made   Disposition: Follow-up in 12 months   Signed, Ida Rogue, MD  10/22/2018 11:54 AM    [place] Office [address] [place] [address], [place], [place] [address]

## 2018-10-22 ENCOUNTER — Telehealth (INDEPENDENT_AMBULATORY_CARE_PROVIDER_SITE_OTHER): Payer: BLUE CROSS/BLUE SHIELD | Admitting: Cardiovascular Disease

## 2018-10-22 ENCOUNTER — Telehealth: Payer: Self-pay

## 2018-10-22 ENCOUNTER — Other Ambulatory Visit: Payer: Self-pay

## 2018-10-22 ENCOUNTER — Telehealth: Payer: Self-pay | Admitting: Cardiovascular Disease

## 2018-10-22 VITALS — Ht 68.0 in | Wt 205.0 lb

## 2018-10-22 DIAGNOSIS — I639 Cerebral infarction, unspecified: Secondary | ICD-10-CM

## 2018-10-22 DIAGNOSIS — E119 Type 2 diabetes mellitus without complications: Secondary | ICD-10-CM | POA: Diagnosis not present

## 2018-10-22 DIAGNOSIS — I480 Paroxysmal atrial fibrillation: Secondary | ICD-10-CM

## 2018-10-22 NOTE — Telephone Encounter (Signed)
The pt called requesting a letter to release him back to work after a recent CVA on 10/15/18. He was just seen by Dr. Mariah Milling today and patient reports that he had no concerns and told him to f/u in a year. I ask the patient if he told Dr. Mariah Milling he wanted to return to work. He stated he forgot to mention it doing his visit. I suggested that he give them a call back to see if there office would be willing to release him back to work. I also informed the patient if he had any issues to give [place] a call back. He verified understanding, no questions or concerns.

## 2018-10-22 NOTE — Telephone Encounter (Signed)
Patient wants a letter to go back to work with no restrictions .  Please mail.

## 2018-10-22 NOTE — Patient Instructions (Signed)

## 2018-10-26 NOTE — Telephone Encounter (Signed)
Cleared from a cardiac perspective to go back to work, no restrictions

## 2018-10-27 ENCOUNTER — Encounter: Payer: Self-pay | Admitting: *Deleted

## 2018-10-27 NOTE — Telephone Encounter (Signed)
Letter completed and ready to be printed and placed up front for him to pick up. Please let me know if I can assist any further.

## 2018-10-27 NOTE — Telephone Encounter (Signed)
Made patient aware that return to work letter is available to pick up in the office.

## 2018-10-27 NOTE — Telephone Encounter (Signed)
Patient calling to check on status of return to work note Has not received, would like to know if he can come by and pick it up so he can go back to work Please call to discuss

## 2018-11-10 ENCOUNTER — Other Ambulatory Visit: Payer: Self-pay

## 2018-11-10 DIAGNOSIS — E1165 Type 2 diabetes mellitus with hyperglycemia: Secondary | ICD-10-CM

## 2018-12-22 ENCOUNTER — Ambulatory Visit: Payer: BLUE CROSS/BLUE SHIELD | Admitting: Nurse Practitioner

## 2018-12-22 ENCOUNTER — Other Ambulatory Visit: Payer: Self-pay

## 2019-01-07 ENCOUNTER — Ambulatory Visit (INDEPENDENT_AMBULATORY_CARE_PROVIDER_SITE_OTHER): Payer: BLUE CROSS/BLUE SHIELD | Admitting: Nurse Practitioner

## 2019-01-07 ENCOUNTER — Encounter: Payer: Self-pay | Admitting: Nurse Practitioner

## 2019-01-07 ENCOUNTER — Other Ambulatory Visit: Payer: Self-pay

## 2019-01-07 VITALS — BP 115/61 | HR 58 | Temp 98.1°F | Ht 68.0 in | Wt 211.4 lb

## 2019-01-07 DIAGNOSIS — E1165 Type 2 diabetes mellitus with hyperglycemia: Secondary | ICD-10-CM

## 2019-01-07 DIAGNOSIS — Z8673 Personal history of transient ischemic attack (TIA), and cerebral infarction without residual deficits: Secondary | ICD-10-CM

## 2019-01-07 LAB — POCT GLYCOSYLATED HEMOGLOBIN (HGB A1C): Hemoglobin A1C: 7.8 % — AB (ref 4.0–5.6)

## 2019-01-07 MED ORDER — METFORMIN HCL 1000 MG PO TABS: 1000.0000 mg | ORAL_TABLET | Freq: Two times a day (BID) | ORAL | 1 refills | Status: DC

## 2019-01-07 MED ORDER — TRULICITY 0.75 MG/0.5ML ~~LOC~~ SOAJ: 0.7500 mg | SUBCUTANEOUS | 11 refills | Status: DC

## 2019-01-07 MED ORDER — ATORVASTATIN CALCIUM 20 MG PO TABS: 20.0000 mg | ORAL_TABLET | Freq: Every day | ORAL | 3 refills | Status: DC

## 2019-01-07 MED ORDER — CANAGLIFLOZIN 300 MG PO TABS: ORAL_TABLET | ORAL | 1 refills | Status: DC

## 2019-01-07 NOTE — Progress Notes (Signed)
Subjective:    Patient ID: Jeremy Heck., male    DOB: Mar 29, 1956, 63 y.o.   MRN: [id number]  Jeremy Rho. is a 63 y.o. male presenting on 01/07/2019 for Diabetes (elevated bs ranging 130-170 since TIA x 11/2018)   HPI Diabetes: A1c 7.8% today Pt presents today for follow up of Type 2 diabetes mellitus. He is checking fasting am CBG at home with a range of 130-170 - Current diabetic medications include: metformin, Januvia, invokana  - He is not currently symptomatic.  - He denies polydipsia, polyphagia, polyuria, headaches, diaphoresis, shakiness, chills, pain, numbness or tingling in extremities and changes in vision.   - Clinical course has been stable. - He  Reports no regular exercise as he is less active without work over last couple of months. - His diet is moderate in salt, moderate in fat, and moderate in carbohydrates. - Weight trend: stable  PREVENTION: Eye exam current (within one year): no Foot exam current (within one year): no Lipid/ASCVD risk reduction - on statin: not previously - will start atorvastatin today Kidney protection - on ace or arb: no Recent Labs    07/16/18 1530 09/12/18 0855 10/16/18 0258 01/07/19 1009  HGBA1C 7.8* 7.2* 7.4* 7.8*     Social History   Tobacco Use  . Smoking status: Current Every Day Smoker    Types: Cigars  . Smokeless tobacco: Never Used  . Tobacco comment: 3-4 cigars a day  Substance Use Topics  . Alcohol use: No  . Drug use: No    Review of Systems Per HPI unless specifically indicated above     Objective:    BP 115/61 (BP Location: Left Arm, Patient Position: Sitting, Cuff Size: Normal)   Pulse (!) 58   Temp 98.1 F (36.7 C) (Oral)   Ht 5\' 8"  (1.727 m)   Wt 211 lb 6.4 oz (95.9 kg)   BMI 32.14 kg/m   Wt Readings from Last 3 Encounters:  01/07/19 211 lb 6.4 oz (95.9 kg)  10/22/18 205 lb (93 kg)  10/15/18 205 lb 0.4 oz (93 kg)    Physical Exam Vitals signs reviewed.  Constitutional:      General: He is not in acute distress.    Appearance: Normal appearance. He is well-developed and normal weight.  HENT:     Head: Normocephalic and atraumatic.  Cardiovascular:     Rate and Rhythm: Normal rate and regular rhythm.     Pulses:          Radial pulses are 2+ on the right side and 2+ on the left side.       Posterior tibial pulses are 1+ on the right side and 1+ on the left side.     Heart sounds: Normal heart sounds, S1 normal and S2 normal.  Pulmonary:     Effort: Pulmonary effort is normal. No respiratory distress.     Breath sounds: Normal breath sounds and air entry.  Musculoskeletal:     Right lower leg: No edema.     Left lower leg: No edema.  Skin:    General: Skin is warm and dry.     Capillary Refill: Capillary refill takes less than 2 seconds.  Neurological:     General: No focal deficit present.     Mental Status: He is alert and oriented to person, place, and time. Mental status is at baseline.  Psychiatric:        Attention and Perception: Attention normal.  Mood and Affect: Mood normal. Affect is flat.        Behavior: Behavior normal. Behavior is cooperative.    Diabetic Foot Exam - Simple   Simple Foot Form Diabetic Foot exam was performed with the following findings: Yes 01/07/2019 10:25 AM  Visual Inspection No deformities, no ulcerations, no other skin breakdown bilaterally: Yes Sensation Testing Intact to touch and monofilament testing bilaterally: Yes Pulse Check Posterior Tibialis and Dorsalis pulse intact bilaterally: Yes Comments     Results for orders placed or performed in visit on 01/07/19  POCT glycosylated hemoglobin (Hb A1C)  Result Value Ref Range   Hemoglobin A1C 7.8 (A) 4.0 - 5.6 %   HbA1c POC (<> result, manual entry)     HbA1c, POC (prediabetic range)     HbA1c, POC (controlled diabetic range)        Assessment & Plan:   Problem List Items Addressed This Visit      Endocrine   Uncontrolled type 2 diabetes  mellitus with hyperglycemia, without long-term current use of insulin (HCC) - Primary Improved control, but remains above goals.  T2DM with A1c 7.8% Worsening control from last visit and goal A1c < 7.0%. - Complications - Hx of CVA.  Plan:  1. Change therapy: Continue Metformin, Invokana - START Trulicity 0.75 mg weekly (Past GI upset with Ozempic, so trying alternative brand).  When starting Trulicity STOP Januvia. - May need to consider future addition of basal insulin if unable to tolerate GLP1 brands  May also need to trial Bydureon. 2. Encourage improved lifestyle: - low carb/low glycemic diet reinforced prior education - Increase physical activity to 30 minutes most days of the week.  Explained that increased physical activity increases body's use of sugar for energy. 3. Check fasting am CBG and bring log to next visit for review 4. Continue ASA and Statin 5. DM Foot exam done today with normal findings.   and Advised to schedule DM ophtho exam, send record.  6. Follow-up 3 months    Relevant Medications   Dulaglutide (TRULICITY) 0.75 MG/0.5ML SOPN   atorvastatin (LIPITOR) 20 MG tablet   canagliflozin (INVOKANA) 300 MG TABS tablet   metFORMIN (GLUCOPHAGE) 1000 MG tablet   Other Relevant Orders   POCT glycosylated hemoglobin (Hb A1C) (Completed)   Lipid panel   COMPLETE METABOLIC PANEL WITH GFR    Other Visit Diagnoses    History of CVA (cerebrovascular accident)     Patient with history of CVA 10/2018. Patient has not been on statin medication since then.  No recent lipid check.  Uncertain etiology of CVA, but was from cerebral infarction rather than hemorrhage.  No past hyperlipidemia noted.  Plan: 1. Check lipid panel, CMP 2. START atorvastatin 20 mg once daily for secondary prevention  3. Follow-up 3 months.   Relevant Medications   atorvastatin (LIPITOR) 20 MG tablet   Other Relevant Orders   Lipid panel      Meds ordered this encounter  Medications  . Dulaglutide  (TRULICITY) 0.75 MG/0.5ML SOPN    Sig: Inject 0.75 mg into the skin every 7 (seven) days.    Dispense:  4 pen    Refill:  11    Order Specific Question:   Supervising Provider    Answer:   Smitty CordsKARAMALEGOS, ALEXANDER J [[id number]]  . atorvastatin (LIPITOR) 20 MG tablet    Sig: Take 1 tablet (20 mg total) by mouth daily.    Dispense:  90 tablet    Refill:  3  Order Specific Question:   Supervising Provider    Answer:   Smitty CordsKARAMALEGOS, ALEXANDER J [[id number]]  . canagliflozin (INVOKANA) 300 MG TABS tablet    Sig: Take 1 tablet (300 mg total) by mouth daily before breakfast.    Dispense:  90 tablet    Refill:  1    Order Specific Question:   Supervising Provider    Answer:   Smitty CordsKARAMALEGOS, ALEXANDER J [[id number]]  . metFORMIN (GLUCOPHAGE) 1000 MG tablet    Sig: Take 1 tablet (1,000 mg total) by mouth 2 (two) times daily with a meal.    Dispense:  180 tablet    Refill:  1    Order Specific Question:   Supervising Provider    Answer:   Smitty CordsKARAMALEGOS, ALEXANDER J [[id number]]    Follow up plan: Return in about 3 months (around 04/09/2019) for diabetes.  Wilhelmina McardleLauren Manreet Kiernan, DNP, AGPCNP-BC Adult Gerontology Primary Care Nurse Practitioner [facility] 01/07/2019, 10:08 AM

## 2019-01-07 NOTE — Patient Instructions (Addendum)
Sharen Heck.,   Thank you for coming in to clinic today.  1. STOP Januvia the day you start your Trulicity  2. START Trulicity on the day of the week you always want to inject.  One dose in one pen.  Once injected, it should be discarded safely.  3. Continue working to eat a healthy diet.  You are already doing well.   - Stay active with regular exercise, walking and housework/chores.  4. Metformin and Invokana are not changed.  5. START taking atorvastatin 20 mg once daily. - Common side effects are muscle cramps, increased glucose, and rarely rhabdomyolysis.  Please schedule a follow-up appointment with Cassell Smiles, AGNP. Return in about 3 months (around 04/09/2019) for diabetes.  If you have any other questions or concerns, please feel free to call the clinic or send a message through [place]. You may also schedule an earlier appointment if necessary.  You will receive a survey after today's visit either digitally by e-mail or paper by [employer]. Your experiences and feedback matter to [place].  Please respond so we know how we are doing as we provide care for you.   Cassell Smiles, DNP, AGNP-BC Adult Gerontology Nurse Practitioner [place]

## 2019-01-08 LAB — COMPLETE METABOLIC PANEL WITH GFR
AG Ratio: 1.7 (calc) (ref 1.0–2.5)
ALT: 20 U/L (ref 9–46)
AST: 17 U/L (ref 10–35)
Albumin: 4.7 g/dL (ref 3.6–5.1)
Alkaline phosphatase (APISO): 40 U/L (ref 35–144)
BUN/Creatinine Ratio: 24 (calc) — ABNORMAL HIGH (ref 6–22)
BUN: 28 mg/dL — ABNORMAL HIGH (ref 7–25)
CO2: 20 mmol/L (ref 20–32)
Calcium: 9.6 mg/dL (ref 8.6–10.3)
Chloride: 105 mmol/L (ref 98–110)
Creat: 1.16 mg/dL (ref 0.70–1.25)
GFR, Est African American: 78 mL/min/{1.73_m2} (ref >=60)
GFR, Est Non African American: 68 mL/min/{1.73_m2} (ref >=60)
Globulin: 2.8 g/dL (calc) (ref 1.9–3.7)
Glucose, Bld: 168 mg/dL — ABNORMAL HIGH (ref 65–99)
Potassium: 4.3 mmol/L (ref 3.5–5.3)
Sodium: 136 mmol/L (ref 135–146)
Total Bilirubin: 0.5 mg/dL (ref 0.2–1.2)
Total Protein: 7.5 g/dL (ref 6.1–8.1)

## 2019-01-08 LAB — LIPID PANEL
Cholesterol: 128 mg/dL (ref <200)
HDL: 50 mg/dL (ref >=40)
LDL Cholesterol (Calc): 64 mg/dL (calc)
Non-HDL Cholesterol (Calc): 78 mg/dL (calc) (ref <130)
Total CHOL/HDL Ratio: 2.6 (calc) (ref <5.0)
Triglycerides: 59 mg/dL (ref <150)

## 2019-04-09 ENCOUNTER — Ambulatory Visit: Payer: Self-pay | Admitting: Family Medicine

## 2019-04-09 ENCOUNTER — Ambulatory Visit: Payer: BLUE CROSS/BLUE SHIELD | Admitting: Nurse Practitioner

## 2019-04-17 ENCOUNTER — Other Ambulatory Visit: Payer: Self-pay | Admitting: Cardiovascular Disease

## 2019-04-17 NOTE — Telephone Encounter (Signed)
Please review for refill. Thanks!  

## 2019-04-17 NOTE — Telephone Encounter (Signed)
Xarelto 20mg  refill request received. Pt is 63 years old, weight-95.9kg, Crea-1.16 on 01/07/2019, last seen by Dr. Rockey Situ on 10/22/2018 via Telemedicine, Diagnosis-Afib, CrCl-89.54ml/min; Dose is appropriate based on dosing criteria. Will send in refill to requested pharmacy.

## 2019-08-01 ENCOUNTER — Other Ambulatory Visit: Payer: Self-pay | Admitting: Nurse Practitioner

## 2019-08-01 DIAGNOSIS — E1165 Type 2 diabetes mellitus with hyperglycemia: Secondary | ICD-10-CM

## 2019-08-03 ENCOUNTER — Other Ambulatory Visit: Payer: Self-pay

## 2019-08-03 ENCOUNTER — Telehealth: Payer: Self-pay | Admitting: Nurse Practitioner

## 2019-08-03 DIAGNOSIS — Z8673 Personal history of transient ischemic attack (TIA), and cerebral infarction without residual deficits: Secondary | ICD-10-CM

## 2019-08-03 DIAGNOSIS — E1165 Type 2 diabetes mellitus with hyperglycemia: Secondary | ICD-10-CM

## 2019-08-03 NOTE — Telephone Encounter (Signed)
Pt is requesting refill on atorvastatin called into   [facility]  [address]

## 2019-08-04 MED ORDER — ATORVASTATIN CALCIUM 20 MG PO TABS: 20.0000 mg | ORAL_TABLET | Freq: Every day | ORAL | 0 refills | Status: DC

## 2019-08-18 ENCOUNTER — Other Ambulatory Visit: Payer: Self-pay | Admitting: Family Medicine

## 2019-08-18 ENCOUNTER — Encounter: Payer: Self-pay | Admitting: Family Medicine

## 2019-08-18 ENCOUNTER — Ambulatory Visit (INDEPENDENT_AMBULATORY_CARE_PROVIDER_SITE_OTHER): Payer: BLUE CROSS/BLUE SHIELD | Admitting: Family Medicine

## 2019-08-18 ENCOUNTER — Other Ambulatory Visit: Payer: Self-pay

## 2019-08-18 VITALS — BP 135/69 | HR 62 | Temp 97.1°F | Resp 18 | Ht 68.0 in | Wt 207.0 lb

## 2019-08-18 DIAGNOSIS — I480 Paroxysmal atrial fibrillation: Secondary | ICD-10-CM | POA: Diagnosis not present

## 2019-08-18 DIAGNOSIS — F17200 Nicotine dependence, unspecified, uncomplicated: Secondary | ICD-10-CM | POA: Insufficient documentation

## 2019-08-18 DIAGNOSIS — E1165 Type 2 diabetes mellitus with hyperglycemia: Secondary | ICD-10-CM

## 2019-08-18 DIAGNOSIS — Z1211 Encounter for screening for malignant neoplasm of colon: Secondary | ICD-10-CM

## 2019-08-18 DIAGNOSIS — Z8673 Personal history of transient ischemic attack (TIA), and cerebral infarction without residual deficits: Secondary | ICD-10-CM | POA: Diagnosis not present

## 2019-08-18 LAB — POCT GLYCOSYLATED HEMOGLOBIN (HGB A1C): Hemoglobin A1C: 8 % — AB (ref 4.0–5.6)

## 2019-08-18 MED ORDER — ATORVASTATIN CALCIUM 20 MG PO TABS: 20.0000 mg | ORAL_TABLET | Freq: Every day | ORAL | 1 refills | Status: AC

## 2019-08-18 MED ORDER — TRULICITY 0.75 MG/0.5ML ~~LOC~~ SOAJ: 0.7500 mg | SUBCUTANEOUS | 11 refills | Status: AC

## 2019-08-18 MED ORDER — CANAGLIFLOZIN 300 MG PO TABS: ORAL_TABLET | ORAL | 1 refills | Status: AC

## 2019-08-18 MED ORDER — METFORMIN HCL 1000 MG PO TABS: 1000.0000 mg | ORAL_TABLET | Freq: Two times a day (BID) | ORAL | 1 refills | Status: DC

## 2019-08-18 NOTE — Assessment & Plan Note (Signed)
Stable and following with Dr. Mariah Milling in Cardiology.

## 2019-08-18 NOTE — Progress Notes (Signed)
Discussed Cologuard during 08/18/2019 appointment.  Agreeable to Cologuard.  Order placed.

## 2019-08-18 NOTE — Assessment & Plan Note (Signed)
UncontrolledDM with A1c 8.0 Worsening control from 7.8 and goal A1c < 7.0%. - No known complications or hypoglycemia.  Plan:  1. Continue current therapy: Invokana 300mg  daily, Trulicity 0.75mg  weekly and Metformin 1000mg  twice a day 2. Encourage improved lifestyle: - low carb/low glycemic diet reinforced prior education - Increase physical activity to 30 minutes most days of the week.  Explained that increased physical activity increases body's use of sugar for energy. 3. Check fasting am CBG and 140-220.  Bring log to next visit for review 4. Continue Statin 5. Diabetic foot exam: completed and Advised to schedule DM ophtho exam, send record. 6. Follow-up 6 months

## 2019-08-18 NOTE — Patient Instructions (Addendum)
Reminded to have yearly dilated eye exams, daily feet check, and following a heart healthy diet (low fat, low salt, low carb and protein control) along with daily exercise.  Eat a plant based diet for optimal health.  Advised to avoid juices, sodas, rice, pasta, potatoes and bread and to get at least 30 minutes of exercise 5x a week.    Advice on Protecting Your Feet   1. Daily foot inspections - Look for any breaks in the skin or areas of irritation such as blisters or red areas. Report to primary doctor or foot nurse immediately if any problems occur.  - If you have vision problems and cannot see your feet well or it is hard for you to reach your feet, ask a family member to inspect your feet daily.   2. Daily foot hygiene  - Wash feet daily, but do not soak your feet in hot water. If you do have callus, you may use warm water epsom salt foot soak and use moisturizer after. - Dry well especially between toes; Pat dry do not rub.  - If your skin is dry use a lotion to moisturize but never between the toes.  - If your skin is wet from perspiration, use an antifungal foot powder daily.   3. Shoes and Socks  - Wear a clean pair of socks daily.  - Make sure your shoes fit well and that you are measured and properly fit each time you purchase shoes. Your shoe size may change.  - Powder your shoes with a small amount of an antifungal foot powder daily, as the shoe is the only article of clothing that is not laundered.  - Wear appropriate shoes and socks for the weather. It is especially important to protect your feet from the cold; however, don't forget about sunscreen to the tops of your feet in the hot sun.  - Wear well fitting shoes rather than slippers or flip-flops when walking or standing for long period of time.  - When at home make sure you always have protective foot wear on your feet.   You can learn more information online about your diabetes at American Diabetes Association:  http://www.diabetes.org/ - General self-care (diet, medications, blood sugar checks). - Diet recommendations - There are even recipes available for you to look at and try.  Eat at least 3 meals and 1-2 snacks per day (don't skip breakfast).  Aim for no more than 5 hours between eating. - Tip: If you go >5 hours without eating and become very hungry, your body will supply it's own resources temporarily and you can gain extra weight when you eat.  The 5 Minute Rule of Exercise - Promise yourself to at least do 5 minutes of exercise (make sure you time it), and if at the end of 5 minutes (this is the hardest part of the work-out), if you still feel like you want stop (or not motivated to continue) then allow yourself to stop. Otherwise, more often than not you will feel encouraged that you can continue for a little while longer or even more!  My 5 to Fitness!  5: fruits and vegetables per day (work on 9 per day if you are at 5) 4: exercise 4-5 times per week for at least 30 minutes (walking counts!) 3: meals per day (don't skip breakfast!), no more than 5 hours between meals 2: habits to quit -smoking -excess alcohol use (men >2 beer/day; women >1beer/day) 1: sweet per day (2 cookies,  1 small cup of ice cream, 12 oz soda)  These are general tips for healthy living. Try to start with 1 or 2 habit TODAY and make it a part of your life for several months. You set a goal today to work on:  Once you have 1 or 2 habits down for several months, try to begin working on your next healthy habit. With every single step you take, you will be leading a healthier lifestyle!   Diet Recommendations for Diabetes   Starchy (carb) foods include: Bread, rice, pasta, potatoes, corn, crackers, bagels, muffins, all baked goods.   Protein foods include: Meat, fish, poultry, eggs, dairy foods, and beans such as pinto and kidney beans (beans also provide carbohydrate).   1. Eat at least 3 meals and 1-2 snacks per  day. Never go more than 4-5 hours while awake without eating.   2. Limit starchy foods to TWO per meal and ONE per snack. ONE portion of a starchy  food is equal to the following:   - ONE slice of bread (or its equivalent, such as half of a hamburger bun).   - 1/2 cup of a "scoopable" starchy food such as potatoes or rice.   - 1 OUNCE (28 grams) of starchy snacks (crackers or pretzels, look on label).   - 15 grams of carbohydrate as shown on food label.   3. Both lunch and dinner should include a protein food, a carb food, and vegetables.   - Obtain twice as many veg's as protein or carbohydrate foods for both lunch and dinner.   - Try to keep frozen veg's on hand for a quick vegetable serving.     - Fresh or frozen veg's are best.   4. Breakfast should always include protein.    We will plan to see you back in 6 months for follow up on your diabetes.  You will receive a survey after today's visit either digitally by e-mail or paper by [employer]. Your experiences and feedback matter to [place].  Please respond so we know how we are doing as we provide care for you.  Call us with any questions/concerns/needs.  It is my goal to be available to you for your health concerns.  Thanks for choosing me to be a partner in your healthcare needs!  Charlaine Dalton, FNP-C Family Nurse Practitioner [facility] Phone: [phone]

## 2019-08-18 NOTE — Progress Notes (Signed)
Subjective:    Patient ID: Jeremy Koch., male    DOB: Oct 12, 1955, 64 y.o.   MRN: [id number]  Jeremy Grieco. is a 64 y.o. male presenting on 08/18/2019 for Diabetes   HPI  Pt presents today for follow up Type 2 Diabetes Mellitus.  He/she (caps): He is checking AM CBG at home with range of 140-220. -Current diabetic medications include: Invokana 300mg  daily, trulicity 0.75mg  into the skin weekly, and metformin 1000mg  twice a day with meals -Is not currently symptomatic -Actions; denies/reports/admits to: denies polydipsia, polyphagia, polyuria, headaches, diaphoresis, shakiness, chills, pain, numbness or tingling in extremities or changes in vision -Clinical course has been improving/declining -Reports No structured exercise routine -Diet is moderate in salt, moderate in fat, and moderate in carbohydrates -Weight trend is trend: stable  PREVENTION Eye exam current (within 1 year) Reports having completed eye exam with Dr. in [facility], will request records Foot exam current (within 1 year) Completed today Lipid/ASCVD risk reduction - on statin: YES/NO: Yes   Kidney Protection (On ACE/ARB)? YES/NO: Yes   Depression screen [facility] 2/9 01/07/2019 06/05/2017 08/01/2015  Decreased Interest 0 0 0  Down, Depressed, Hopeless 0 0 0  PHQ - 2 Score 0 0 0    Social History   Tobacco Use  . Smoking status: Current Every Day Smoker    Types: Cigars  . Smokeless tobacco: Never Used  . Tobacco comment: 3-4 cigars a day  Substance Use Topics  . Alcohol use: No  . Drug use: No    Review of Systems  Constitutional: Negative.   HENT: Negative.   Eyes: Negative.   Respiratory: Negative.   Cardiovascular: Negative.   Gastrointestinal: Negative.   Endocrine: Negative.   Genitourinary: Negative.   Musculoskeletal: Negative.   Skin: Negative.   Allergic/Immunologic: Negative.   Neurological: Negative.   Hematological: Negative.   Psychiatric/Behavioral: Negative.     Per HPI unless specifically indicated above     Objective:    BP 135/69 (BP Location: Right Arm, Patient Position: Sitting, Cuff Size: Normal)   Pulse 62   Temp (!) 97.1 F (36.2 C) (Temporal)   Resp 18   Ht 5\' 8"  (1.727 m)   Wt 207 lb (93.9 kg)   SpO2 100%   BMI 31.47 kg/m   Wt Readings from Last 3 Encounters:  08/18/19 207 lb (93.9 kg)  01/07/19 211 lb 6.4 oz (95.9 kg)  10/22/18 205 lb (93 kg)    Physical Exam Vitals reviewed.  Constitutional:      General: He is not in acute distress.    Appearance: Normal appearance. He is well-groomed and normal weight. He is not ill-appearing or toxic-appearing.  HENT:     Head: Normocephalic.     Right Ear: Tympanic membrane, ear canal and external ear normal. There is no impacted cerumen.     Left Ear: Tympanic membrane, ear canal and external ear normal. There is no impacted cerumen.     Nose: Nose normal. No congestion or rhinorrhea.     Mouth/Throat:     Mouth: Mucous membranes are moist.     Pharynx: Oropharynx is clear. No oropharyngeal exudate or posterior oropharyngeal erythema.  Eyes:     General: Lids are normal. Vision grossly intact. No scleral icterus.       Right eye: No discharge or hordeolum.        Left eye: No discharge or hordeolum.     Extraocular Movements: Extraocular movements intact.  Conjunctiva/sclera: Conjunctivae normal.     Pupils: Pupils are equal, round, and reactive to light.  Neck:     Thyroid: No thyroid mass, thyromegaly or thyroid tenderness.  Cardiovascular:     Rate and Rhythm: Normal rate and regular rhythm.     Pulses: Normal pulses.          Dorsalis pedis pulses are 2+ on the right side and 2+ on the left side.       Posterior tibial pulses are 2+ on the right side and 2+ on the left side.     Heart sounds: Normal heart sounds. No murmur. No friction rub. No gallop.   Pulmonary:     Effort: Pulmonary effort is normal. No respiratory distress.     Breath sounds: Normal breath  sounds.  Abdominal:     General: Abdomen is flat. Bowel sounds are normal. There is no distension or abdominal bruit.     Palpations: Abdomen is soft. There is no hepatomegaly, splenomegaly or mass.     Tenderness: There is no abdominal tenderness. There is no guarding or rebound.     Hernia: No hernia is present.  Musculoskeletal:        General: Normal range of motion.     Cervical back: Normal range of motion and neck supple. No tenderness.     Right lower leg: No edema.     Left lower leg: No edema.  Feet:     Right foot:     Skin integrity: Skin integrity normal.     Left foot:     Skin integrity: Skin integrity normal.     Comments: Absent of callus and all other lesions.  Some dry skin noted, but pt reports regular foot care and can see bottom of his feet. Lymphadenopathy:     Cervical: No cervical adenopathy.  Skin:    General: Skin is warm and dry.     Capillary Refill: Capillary refill takes less than 2 seconds.  Neurological:     General: No focal deficit present.     Mental Status: He is alert and oriented to person, place, and time.     Cranial Nerves: Cranial nerves are intact.     Sensory: Sensation is intact.     Motor: Motor function is intact.     Coordination: Coordination is intact.     Gait: Gait is intact.     Deep Tendon Reflexes: Reflexes normal.  Psychiatric:        Attention and Perception: Attention and perception normal.        Mood and Affect: Mood and affect normal.        Speech: Speech normal.        Behavior: Behavior normal. Behavior is cooperative.        Thought Content: Thought content normal.        Cognition and Memory: Cognition and memory normal.        Judgment: Judgment normal.     Results for orders placed or performed in visit on 08/18/19  POCT glycosylated hemoglobin (Hb A1C)  Result Value Ref Range   Hemoglobin A1C 8.0 (A) 4.0 - 5.6 %   HbA1c POC (<> result, manual entry)     HbA1c, POC (prediabetic range)     HbA1c, POC  (controlled diabetic range)        Assessment & Plan:   Problem List Items Addressed This Visit      Cardiovascular and Mediastinum   PAF (paroxysmal atrial  fibrillation) (HCC)    Stable and following with Dr. Mariah Milling in Cardiology.       Relevant Medications   atorvastatin (LIPITOR) 20 MG tablet     Endocrine   Uncontrolled type 2 diabetes mellitus with hyperglycemia, without long-term current use of insulin (HCC) - Primary    UncontrolledDM with A1c 8.0 Worsening control from 7.8 and goal A1c < 7.0%. - No known complications or hypoglycemia.  Plan:  1. Continue current therapy: Invokana 300mg  daily, Trulicity 0.75mg  weekly and Metformin 1000mg  twice a day 2. Encourage improved lifestyle: - low carb/low glycemic diet reinforced prior education - Increase physical activity to 30 minutes most days of the week.  Explained that increased physical activity increases body's use of sugar for energy. 3. Check fasting am CBG and 140-220.  Bring log to next visit for review 4. Continue Statin 5. Diabetic foot exam: completed and Advised to schedule DM ophtho exam, send record. 6. Follow-up 6 months      Relevant Medications   atorvastatin (LIPITOR) 20 MG tablet   Dulaglutide (TRULICITY) 0.75 MG/0.5ML SOPN   canagliflozin (INVOKANA) 300 MG TABS tablet   metFORMIN (GLUCOPHAGE) 1000 MG tablet   Other Relevant Orders   POCT glycosylated hemoglobin (Hb A1C) (Completed)   CBC with Differential   COMPLETE METABOLIC PANEL WITH GFR   Lipid Profile    Other Visit Diagnoses    History of CVA (cerebrovascular accident)       Relevant Medications   atorvastatin (LIPITOR) 20 MG tablet      Meds ordered this encounter  Medications  . atorvastatin (LIPITOR) 20 MG tablet    Sig: Take 1 tablet (20 mg total) by mouth daily.    Dispense:  90 tablet    Refill:  1    Need appointment with office  . Dulaglutide (TRULICITY) 0.75 MG/0.5ML SOPN    Sig: Inject 0.75 mg into the skin every 7  (seven) days.    Dispense:  4 pen    Refill:  11  . canagliflozin (INVOKANA) 300 MG TABS tablet    Sig: Take 1 tablet (300 mg total) by mouth daily before breakfast.    Dispense:  90 tablet    Refill:  1  . metFORMIN (GLUCOPHAGE) 1000 MG tablet    Sig: Take 1 tablet (1,000 mg total) by mouth 2 (two) times daily with a meal.    Dispense:  180 tablet    Refill:  1      Follow up plan: Return in about 6 months (around 02/18/2020) for DM F/U + A1C.   , FNP Family Nurse Practitioner [facility] 08/18/2019, 9:29 AM

## 2019-08-22 ENCOUNTER — Ambulatory Visit: Payer: Self-pay | Attending: Internal Medicine

## 2019-08-22 ENCOUNTER — Other Ambulatory Visit: Payer: Self-pay

## 2019-08-22 DIAGNOSIS — Z23 Encounter for immunization: Secondary | ICD-10-CM

## 2019-08-22 NOTE — Progress Notes (Signed)
   [id number]  Name:  Jeremy Kelly.    MRN: [id number] DOB: 1955/11/13  08/22/2019  [name] was observed post Covid-19 immunization for 15 minutes without incident. He was provided with Vaccine Information Sheet and instruction to access the V-Safe system.   [name] was instructed to call 911 with any severe reactions post vaccine: Marland Kitchen Difficulty breathing  . Swelling of face and throat  . A fast heartbeat  . A bad rash all over body  . Dizziness and weakness   Immunizations Administered    Name Date Dose VIS Date Route   Pfizer COVID-19 Vaccine 08/22/2019  9:52 AM 0.3 mL 05/22/2019 Intramuscular   Manufacturer: [employer], [employer]   Lot: [id number]   NDC: [address]-8

## 2019-09-15 ENCOUNTER — Ambulatory Visit: Payer: Self-pay | Attending: Internal Medicine

## 2019-09-15 DIAGNOSIS — Z23 Encounter for immunization: Secondary | ICD-10-CM

## 2019-09-15 NOTE — Progress Notes (Signed)
   [id number]  Name:  Jeremy Kelly.    MRN: [id number] DOB: November 12, 1955  09/15/2019  [name] was observed post Covid-19 immunization for 15 minutes without incident. He was provided with Vaccine Information Sheet and instruction to access the V-Safe system.   [name] was instructed to call 911 with any severe reactions post vaccine: Marland Kitchen Difficulty breathing  . Swelling of face and throat  . A fast heartbeat  . A bad rash all over body  . Dizziness and weakness   Immunizations Administered    Name Date Dose VIS Date Route   Pfizer COVID-19 Vaccine 09/15/2019 11:58 AM 0.3 mL 05/22/2019 Intramuscular   Manufacturer: [employer], [employer]   Lot: [id number]   NDC: [address]-0

## 2019-09-23 NOTE — Telephone Encounter (Signed)
Close encounter 

## 2019-10-12 ENCOUNTER — Other Ambulatory Visit: Payer: Self-pay | Admitting: Cardiovascular Disease

## 2019-10-12 NOTE — Telephone Encounter (Signed)
Refill request

## 2019-10-12 NOTE — Telephone Encounter (Signed)
Patient wants to ask insurance about coverage and will call back if able to schedule a visit with [place].   Closing encounter for now but recall still in place.

## 2019-10-12 NOTE — Telephone Encounter (Signed)
Pt's age 64, wt 93.9 kg, SCr 1.16, CrCl  87.69, last ov w/ TG 10/22/18. Pt is due for labs & appt w/ TG. Will send in 30 day supply of Xarelto until pt can get into the office. Forwarding to scheduling.

## 2020-05-04 ENCOUNTER — Other Ambulatory Visit: Payer: Self-pay | Admitting: Family Medicine

## 2020-05-04 DIAGNOSIS — E1165 Type 2 diabetes mellitus with hyperglycemia: Secondary | ICD-10-CM

## 2020-05-04 NOTE — Telephone Encounter (Signed)
Patient is now seeing Dr Lindwood Qua at [facility]  I accidentally signed the refill encounter which sent the refill instead of canceling out the refill request.  I called [facility], they cancelled my re order e script and will route it to Dr Mikey Bussing.  Saralyn Pilar, DO [facility] 05/04/2020, 5:47 PM

## 2020-05-04 NOTE — Telephone Encounter (Signed)
Requested medication (s) are due for refill today: Yes  Requested medication (s) are on the active medication list: Yes  Last refill:  08/18/19  Future visit scheduled: No  Notes to clinic:  Is pt. Still seeing Ms. Malfi?    Requested Prescriptions  Pending Prescriptions Disp Refills   metFORMIN (GLUCOPHAGE) 1000 MG tablet [Pharmacy Med Name: METFORMIN HCL 1,000 MG TABLET] 180 tablet 0    Sig: Take 1 tablet (1,000 mg total) by mouth 2 (two) times daily with a meal.      Endocrinology:  Diabetes - Biguanides Failed - 05/04/2020 12:38 PM      Failed - Cr in normal range and within 360 days    Creat  Date Value Ref Range Status  01/07/2019 1.16 0.70 - 1.25 mg/dL Final    Comment:    For patients >90 years of age, the reference limit for Creatinine is approximately 13% higher for people identified as African-American. .           Failed - HBA1C is between 0 and 7.9 and within 180 days    Hemoglobin A1C  Date Value Ref Range Status  08/18/2019 8.0 (A) 4.0 - 5.6 % Final   Hgb A1c MFr Bld  Date Value Ref Range Status  10/16/2018 7.4 (H) 4.8 - 5.6 % Final    Comment:    (NOTE) Pre diabetes:          5.7%-6.4% Diabetes:              >6.4% Glycemic control for   <7.0% adults with diabetes           Failed - eGFR in normal range and within 360 days    GFR, Est African American  Date Value Ref Range Status  01/07/2019 78 > OR = 60 mL/min/1.37m Final   GFR, Est Non African American  Date Value Ref Range Status  01/07/2019 68 > OR = 60 mL/min/1.732mFinal          Failed - Valid encounter within last 6 months    Recent Outpatient Visits           8 months ago Uncontrolled type 2 diabetes mellitus with hyperglycemia, without long-term current use of insulin ([facility]   1 year ago Uncontrolled type 2 diabetes mellitus with hyperglycemia, without long-term current use of insulin ([facility]  [facility]   1 year ago Uncontrolled type 2 diabetes mellitus with hyperglycemia, without long-term current use of insulin ([facility]  [facility] IvoryNP   1 year ago Type 2 diabetes mellitus with hyperglycemia, without long-term current use of insulin ([facility]  [facility] IvoryNP   2 years ago Type 2 diabetes mellitus with hyperglycemia, [facility] long-term current use of [facility] ([facility]  [facility] PumaLaJer[facility] IvoryNP

## 2020-08-02 ENCOUNTER — Telehealth: Payer: Self-pay | Admitting: Cardiovascular Disease

## 2020-08-02 NOTE — Telephone Encounter (Signed)
Patient declined recall oon insurance now.  Deleting per request.

## 2021-01-26 IMAGING — MR MRI HEAD WITHOUT CONTRAST
9 of 10 series · 42 of 48 positions shown · non-contrast
Comparison: Head CT 10/15/2018

CLINICAL DATA: Right-sided facial and arm numbness.

EXAM:
MRI HEAD WITHOUT CONTRAST
TECHNIQUE: Multiplanar, multiecho pulse sequences of the brain and surrounding
structures were obtained without intravenous contrast.

[Series 2: ax dwi_tracew · axial · 3.0mm · 0.68mm/px · z∈[-56,+106]mm · 7 of 55 slices shown]
[im 1/55]
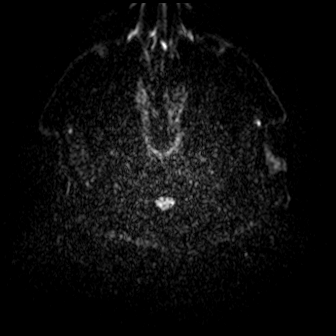
[im 10/55]
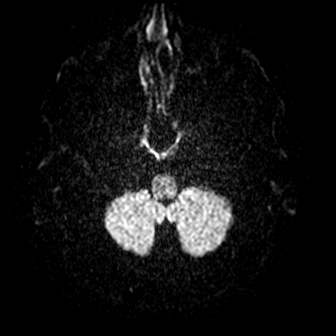
[im 19/55]
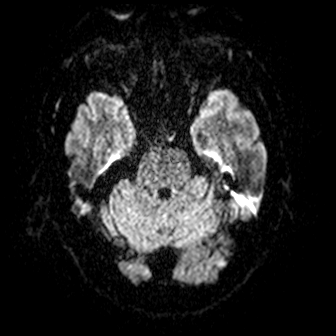
[im 28/55]
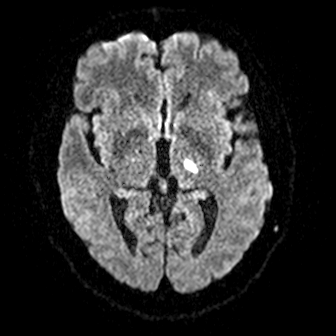
[im 37/55]
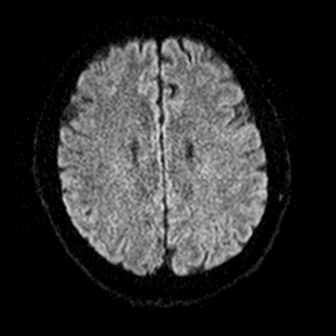
[im 46/55]
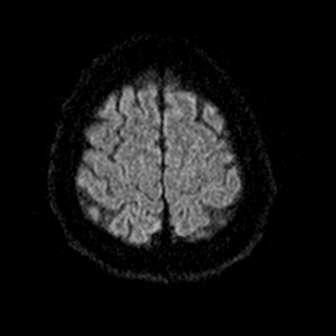
[im 55/55]
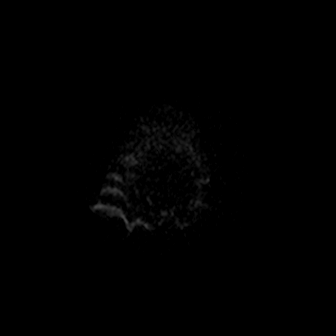

[Series 3: ax dwi_adc · axial · 3.0mm · 0.68mm/px · z∈[-56,+106]mm · 7 of 55 slices shown]
[im 1/55]
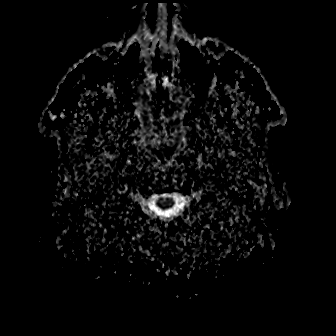
[im 10/55]
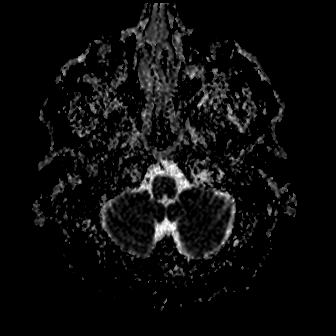
[im 19/55]
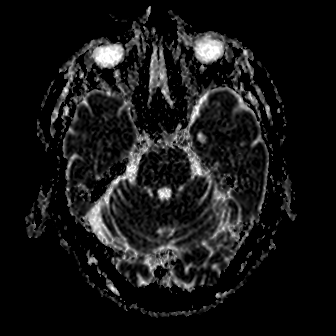
[im 28/55]
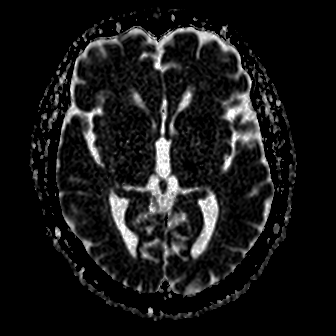
[im 37/55]
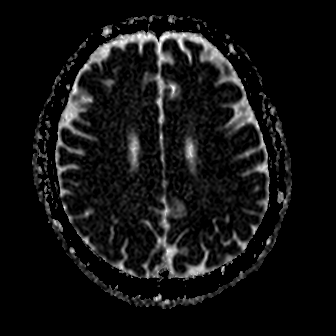
[im 46/55]
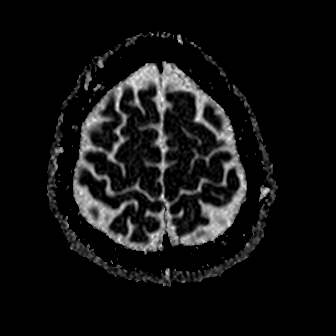
[im 55/55]
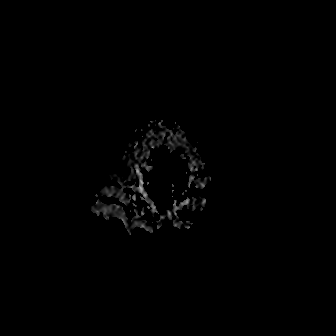

[Series 4: cor dwi_tracew · coronal · 5.0mm · 0.68mm/px · 5 of 40 slices shown]
[im 1/40]
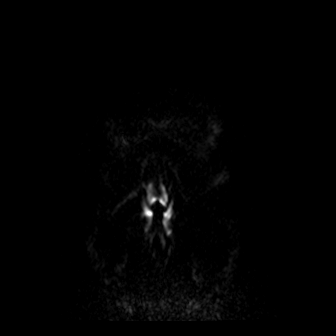
[im 10/40]
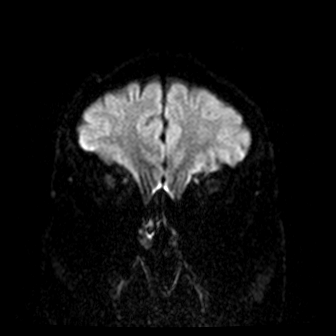
[im 20/40]
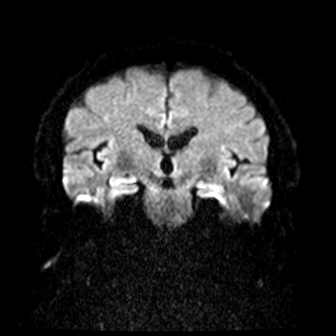
[im 30/40]
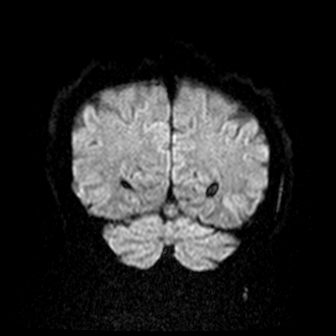
[im 40/40]
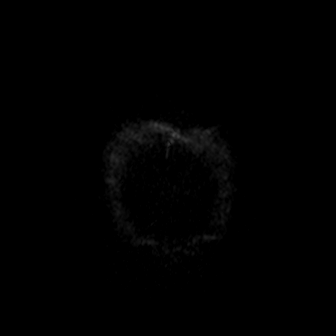

[Series 5: cor dwi_adc · coronal · 5.0mm · 0.68mm/px · 3 of 40 slices shown]
[im 1/40]
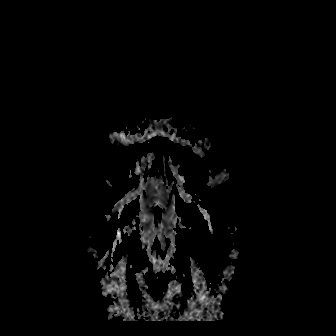
[im 10/40]
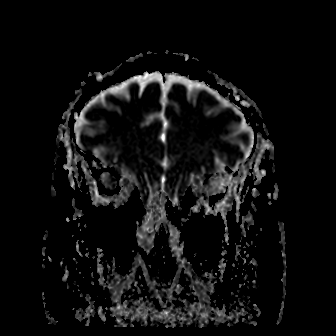
[im 20/40]
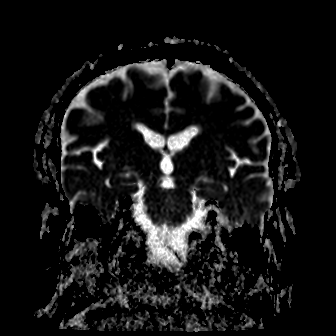

[Series 6: T1 · sagittal · 5.0mm · 0.94mm/px · 3 of 23 slices shown (1 of 2)]
[im 1/23]
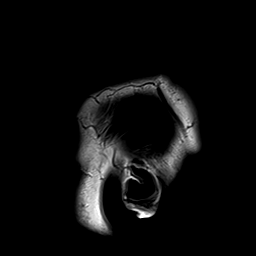
[im 12/23]
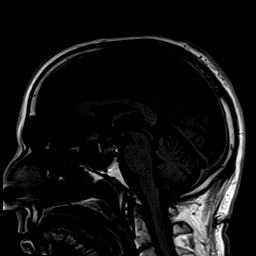
[im 23/23]
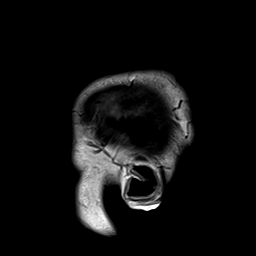

[Series 7: T2 · axial · 5.0mm · 0.45mm/px · z∈[-53,+102]mm · 4 of 27 slices shown (1 of 2)]
[im 1/27]
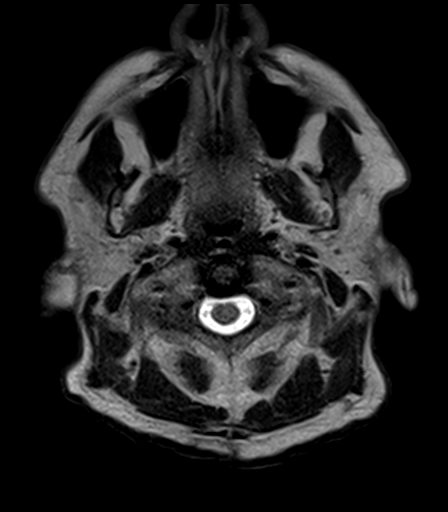
[im 9/27]
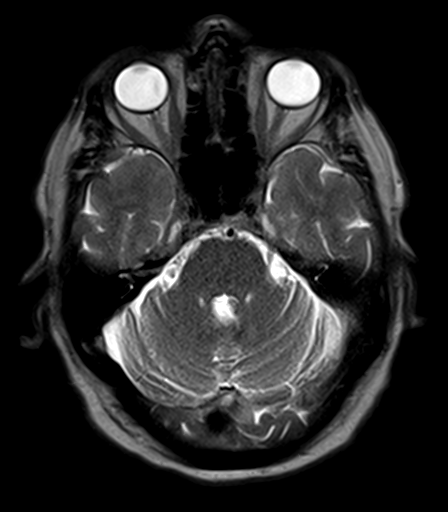
[im 18/27]
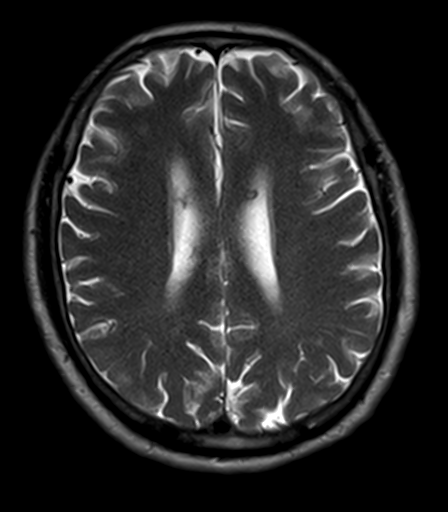
[im 27/27]
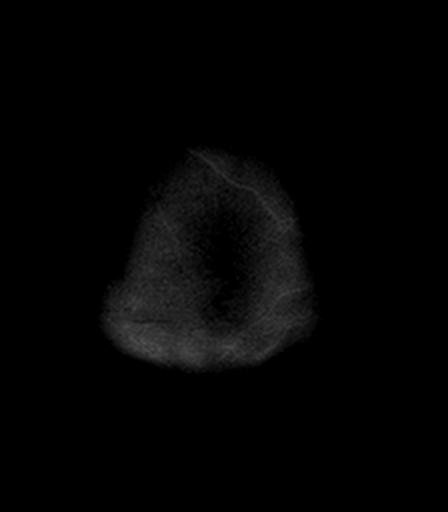

[Series 9: FLAIR · axial · 5.0mm · 1.20mm/px · z∈[-53,+103]mm · 4 of 27 slices shown]
[im 1/27]
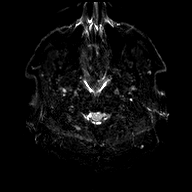
[im 9/27]
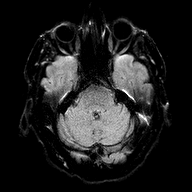
[im 18/27]
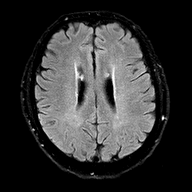
[im 27/27]
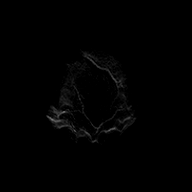

[Series 10: T1 · axial · 5.0mm · 0.90mm/px · z∈[-53,+102]mm · 4 of 27 slices shown (2 of 2)]
[im 1/27]
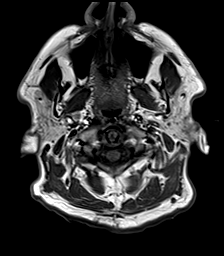
[im 9/27]
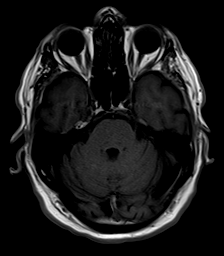
[im 18/27]
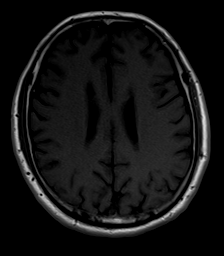
[im 27/27]
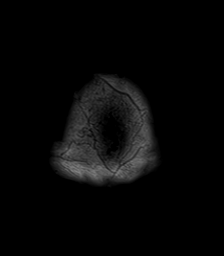

[Series 11: T2 · coronal · 5.0mm · 0.45mm/px · 5 of 34 slices shown (2 of 2)]
[im 1/34]
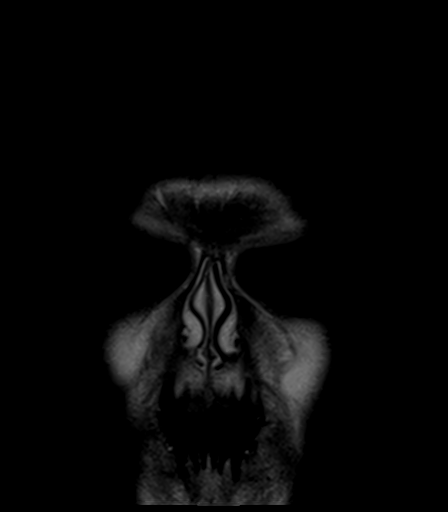
[im 9/34]
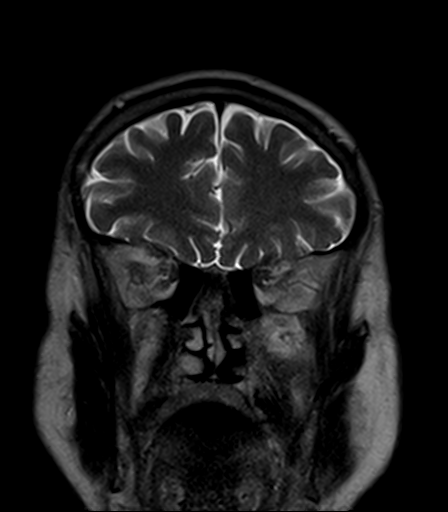
[im 17/34]
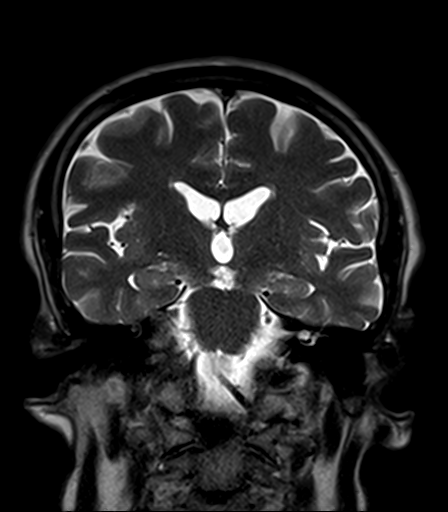
[im 25/34]
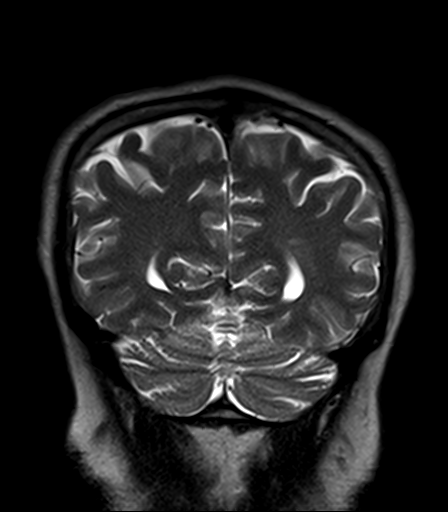
[im 34/34]
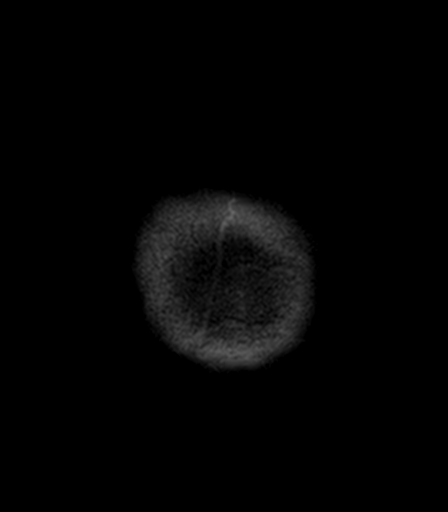

[42 of 48 positions shown; findings below may reference images not displayed]

FINDINGS: Brain: A 1 cm acute infarct is present in the lateral aspect of the
left thalamus. Scattered small foci of T2 hyperintensity in the
cerebral white matter bilaterally are nonspecific but compatible
with mild chronic small vessel ischemic disease. The ventricles and
sulci are within normal limits for age. No intracranial hemorrhage,
mass, midline shift, or extra-axial fluid collection is identified.

Vascular: Major intracranial vascular flow voids are preserved.

Skull and upper cervical spine: Unremarkable bone marrow signal.

Sinuses/Orbits: Unremarkable orbits. Paranasal sinuses and mastoid
air cells are clear.

Other: None.
IMPRESSION: 1. Acute left thalamic infarct.
2. Mild chronic small vessel ischemic disease.
